# Patient Record
Sex: Female | Born: 1940 | Race: Black or African American | Hispanic: No | Marital: Single | Smoking: Never smoker
Health system: Southern US, Community
[De-identification: ages and names within clinical notes are randomized; demographics above are authoritative.]

## PROBLEM LIST (undated history)

## (undated) DIAGNOSIS — C229 Malignant neoplasm of liver, not specified as primary or secondary: Secondary | ICD-10-CM

## (undated) DIAGNOSIS — I10 Essential (primary) hypertension: Secondary | ICD-10-CM

## (undated) HISTORY — PX: JOINT REPLACEMENT: SHX530

## (undated) HISTORY — PX: HERNIA REPAIR: SHX51

## (undated) HISTORY — PX: ABDOMINAL HYSTERECTOMY: SHX81

## (undated) HISTORY — PX: LIVER SURGERY: SHX698

---

## 1999-09-29 ENCOUNTER — Ambulatory Visit (HOSPITAL_COMMUNITY): Admission: RE | Admit: 1999-09-29 | Discharge: 1999-09-29 | Payer: Self-pay | Admitting: Orthopedic Surgery

## 1999-09-29 ENCOUNTER — Encounter: Payer: Self-pay | Admitting: Orthopedic Surgery

## 1999-10-02 ENCOUNTER — Encounter: Admission: RE | Admit: 1999-10-02 | Discharge: 1999-12-31 | Payer: Self-pay | Admitting: Orthopedic Surgery

## 1999-10-16 ENCOUNTER — Ambulatory Visit (HOSPITAL_COMMUNITY): Admission: RE | Admit: 1999-10-16 | Discharge: 1999-10-16 | Payer: Self-pay | Admitting: Orthopedic Surgery

## 1999-10-16 ENCOUNTER — Encounter: Payer: Self-pay | Admitting: Orthopedic Surgery

## 1999-11-18 ENCOUNTER — Encounter: Payer: Self-pay | Admitting: Orthopedic Surgery

## 1999-11-18 ENCOUNTER — Ambulatory Visit (HOSPITAL_COMMUNITY): Admission: RE | Admit: 1999-11-18 | Discharge: 1999-11-18 | Payer: Self-pay | Admitting: Orthopedic Surgery

## 1999-12-19 ENCOUNTER — Ambulatory Visit (HOSPITAL_COMMUNITY): Admission: RE | Admit: 1999-12-19 | Discharge: 1999-12-19 | Payer: Self-pay | Admitting: Orthopedic Surgery

## 1999-12-19 ENCOUNTER — Encounter: Payer: Self-pay | Admitting: Orthopedic Surgery

## 2011-04-04 ENCOUNTER — Encounter: Payer: Self-pay | Admitting: *Deleted

## 2011-04-04 ENCOUNTER — Emergency Department (HOSPITAL_BASED_OUTPATIENT_CLINIC_OR_DEPARTMENT_OTHER)
Admission: EM | Admit: 2011-04-04 | Discharge: 2011-04-04 | Disposition: A | Discharge status: Home / Self Care | Payer: Medicare Other | Attending: Emergency Medicine | Admitting: Emergency Medicine

## 2011-04-04 DIAGNOSIS — I10 Essential (primary) hypertension: Secondary | ICD-10-CM | POA: Insufficient documentation

## 2011-04-04 DIAGNOSIS — B029 Zoster without complications: Secondary | ICD-10-CM | POA: Insufficient documentation

## 2011-04-04 HISTORY — DX: Essential (primary) hypertension: I10

## 2011-04-04 MED ORDER — ACYCLOVIR 200 MG PO CAPS: 800.0000 mg | ORAL_CAPSULE | Freq: Every day | ORAL | Status: AC

## 2011-04-04 MED ORDER — HYDROCODONE-ACETAMINOPHEN 5-325 MG PO TABS: 1.0000 | ORAL_TABLET | Freq: Three times a day (TID) | ORAL | Status: AC | PRN

## 2011-04-04 MED ORDER — ACYCLOVIR 200 MG PO CAPS
800.0000 mg | ORAL_CAPSULE | Freq: Once | ORAL | Status: AC
  Administered 2011-04-04: 800 mg via ORAL
  Filled 2011-04-04: qty 4

## 2011-04-04 NOTE — ED Notes (Signed)
MD at bedside now.

## 2011-04-04 NOTE — ED Provider Notes (Signed)
History     CSN: [id number] Arrival date & time: 04/04/2011  9:23 PM   First MD Initiated Contact with Patient 04/04/11 2209      Chief Complaint  Patient presents with  . Herpes Zoster    (Consider location/radiation/quality/duration/timing/severity/associated sxs/prior treatment) HPI Patient presents today as after the insidious onset of the right sided rash. She notes that the pain began at the time of rash onset, and since then has been persistent. The pain is described as sharp and burning. The pain is otherwise nonradiating, and the rash has spread to encompass an area from just right of her spine circumferentially wrapping to the right sternal border about the fourth rib. No fevers, no chills, no nausea, vomiting, no chest pain, no dyspnea, no other focal complaints Past Medical History  Diagnosis Date  . Hypertension     Past Surgical History  Procedure Date  . Joint replacement   . Abdominal hysterectomy     History reviewed. No pertinent family history.  History  Substance Use Topics  . Smoking status: Never Smoker   . Smokeless tobacco: Not on file  . Alcohol Use: No    OB History    Grav Para Term Preterm Abortions TAB SAB Ect Mult Living                  Review of Systems  All other systems reviewed and are negative.    Allergies  Review of patient's allergies indicates no known allergies.  Home Medications   Current Outpatient Rx  Name Route Sig Dispense Refill  . VYTORIN PO Oral Take 1 tablet by mouth daily.      . GUAIFENESIN 600 MG PO TB12 Oral Take 1,200 mg by mouth 2 (two) times daily.      Marland Kitchen LISINOPRIL PO Oral Take 1 tablet by mouth daily.      Marland Kitchen LOPRESSOR PO Oral Take 2 tablets by mouth daily.      Frazier Butt OP Ophthalmic Apply 1 drop to eye at bedtime as needed. For dry eyes      . PRESCRIPTION MEDICATION Oral Take 1 tablet by mouth daily. Pain medication     . PRESCRIPTION MEDICATION Oral Take 1 capsule by mouth daily. Acid reflux  medication      . TRIAMTERENE-HCTZ PO Oral Take 1 tablet by mouth daily.        BP 143/73  Pulse 63  Temp(Src) 98.2 F (36.8 C) (Oral)  Resp 16  Ht 5\' 7"  (1.702 m)  Wt 200 lb (90.719 kg)  BMI 31.32 kg/m2  SpO2 100%  Physical Exam  Constitutional: She is oriented to person, place, and time. She appears well-developed and well-nourished. No distress.  HENT:  Head: Normocephalic and atraumatic.  Eyes: EOM are normal. Pupils are equal, round, and reactive to light.  Cardiovascular: Normal rate and regular rhythm.   Murmur heard. Pulmonary/Chest: Breath sounds normal.  Abdominal: She exhibits no distension.  Musculoskeletal: She exhibits no edema and no tenderness.  Neurological: She is alert and oriented to person, place, and time. No cranial nerve deficit.  Skin: Skin is warm and dry. She is not diaphoretic.       There is a rash consistent with zoster about the fourth or fifth thoracic dermatome  Psychiatric: She has a normal mood and affect.    ED Course  Procedures (including critical care time)  Labs Reviewed - No data to display No results found.   No diagnosis found.  MDM  This generally well elderly female presents with several days of rash and pain. On exam she is in no distress and has a rash consistent with herpes zoster. The patient was advised of the likely prolonged course of her illness and the necessity for pain medication and antiretrovirals, as well as the necessity for PMD followup.        Gerhard Munch, MD 04/04/11 2306

## 2011-04-04 NOTE — ED Notes (Signed)
Pt c/o painful rash to right shoulder and chest x 3 day

## 2011-04-04 NOTE — ED Notes (Signed)
Pt c/o pain in right posterior shoulder area with several red lesions noted, pt also has multiple red lesions on right breast.

## 2012-01-23 ENCOUNTER — Emergency Department (HOSPITAL_BASED_OUTPATIENT_CLINIC_OR_DEPARTMENT_OTHER): Payer: Medicare Other

## 2012-01-23 ENCOUNTER — Emergency Department (HOSPITAL_BASED_OUTPATIENT_CLINIC_OR_DEPARTMENT_OTHER)
Admission: EM | Admit: 2012-01-23 | Discharge: 2012-01-23 | Disposition: A | Discharge status: Home / Self Care | Payer: Medicare Other | Attending: Emergency Medicine | Admitting: Emergency Medicine

## 2012-01-23 ENCOUNTER — Other Ambulatory Visit: Payer: Self-pay

## 2012-01-23 ENCOUNTER — Encounter (HOSPITAL_BASED_OUTPATIENT_CLINIC_OR_DEPARTMENT_OTHER): Payer: Self-pay

## 2012-01-23 DIAGNOSIS — Z79899 Other long term (current) drug therapy: Secondary | ICD-10-CM | POA: Insufficient documentation

## 2012-01-23 DIAGNOSIS — R9431 Abnormal electrocardiogram [ECG] [EKG]: Secondary | ICD-10-CM | POA: Insufficient documentation

## 2012-01-23 DIAGNOSIS — I10 Essential (primary) hypertension: Secondary | ICD-10-CM | POA: Insufficient documentation

## 2012-01-23 DIAGNOSIS — J40 Bronchitis, not specified as acute or chronic: Secondary | ICD-10-CM | POA: Insufficient documentation

## 2012-01-23 LAB — BASIC METABOLIC PANEL WITH GFR
BUN: 14 mg/dL (ref 6–23)
CO2: 25 meq/L (ref 19–32)
Calcium: 8.6 mg/dL (ref 8.4–10.5)
Chloride: 105 meq/L (ref 96–112)
Creatinine, Ser: 0.8 mg/dL (ref 0.50–1.10)
GFR calc Af Amer: 83 mL/min — ABNORMAL LOW
GFR calc non Af Amer: 72 mL/min — ABNORMAL LOW
Glucose, Bld: 102 mg/dL — ABNORMAL HIGH (ref 70–99)
Potassium: 3.5 meq/L (ref 3.5–5.1)
Sodium: 140 meq/L (ref 135–145)

## 2012-01-23 LAB — CBC WITH DIFFERENTIAL/PLATELET
Basophils Absolute: 0 10*3/uL (ref 0.0–0.1)
Basophils Relative: 1 % (ref 0–1)
Eosinophils Absolute: 0.1 10*3/uL (ref 0.0–0.7)
Eosinophils Relative: 3 % (ref 0–5)
HCT: 36.9 % (ref 36.0–46.0)
Hemoglobin: 12.5 g/dL (ref 12.0–15.0)
Lymphocytes Relative: 25 % (ref 12–46)
Lymphs Abs: 1.2 10*3/uL (ref 0.7–4.0)
MCH: 28.5 pg (ref 26.0–34.0)
MCHC: 33.9 g/dL (ref 30.0–36.0)
MCV: 84.1 fL (ref 78.0–100.0)
Monocytes Absolute: 0.7 10*3/uL (ref 0.1–1.0)
Monocytes Relative: 14 % — ABNORMAL HIGH (ref 3–12)
Neutro Abs: 2.8 10*3/uL (ref 1.7–7.7)
Neutrophils Relative %: 58 % (ref 43–77)
Platelets: 262 10*3/uL (ref 150–400)
RBC: 4.39 MIL/uL (ref 3.87–5.11)
RDW: 14.7 % (ref 11.5–15.5)
WBC: 4.8 10*3/uL (ref 4.0–10.5)

## 2012-01-23 LAB — TROPONIN I: Troponin I: <0.3 ng/mL

## 2012-01-23 LAB — RAPID STREP SCREEN (MED CTR MEBANE ONLY): Streptococcus, Group A Screen (Direct): NEGATIVE

## 2012-01-23 MED ORDER — DOXYCYCLINE HYCLATE 100 MG PO CAPS: 100.0000 mg | ORAL_CAPSULE | Freq: Two times a day (BID) | ORAL | Status: AC

## 2012-01-23 MED ORDER — TETRACAINE HCL 0.5 % OP SOLN
2.0000 [drp] | Freq: Once | OPHTHALMIC | Status: AC
  Administered 2012-01-23: 2 [drp] via OPHTHALMIC
  Filled 2012-01-23: qty 2

## 2012-01-23 MED ORDER — ALBUTEROL SULFATE HFA 108 (90 BASE) MCG/ACT IN AERS: 1.0000 | INHALATION_SPRAY | Freq: Four times a day (QID) | RESPIRATORY_TRACT | Status: DC | PRN

## 2012-01-23 NOTE — ED Provider Notes (Addendum)
History     CSN: [id number]  Arrival date & time 01/23/12  1747   First MD Initiated Contact with Patient 01/23/12 1806      Chief Complaint  Patient presents with  . Sore Throat  . Fever  . URI    (Consider location/radiation/quality/duration/timing/severity/associated sxs/prior treatment) HPI Comments: Patient complains of one day of body aches, congestion, sore throat and headache. She said intermittent eye itching and cough for the past several weeks. She denies any chest pain or shortness of breath. Her throat is feeling better today. She's had good by mouth intake and urine output. She denies any abdominal pain, nausea or vomiting. She's had rhinorrhea, congestion, body aches and chills and fever. She denies any sick contacts.  The history is provided by the patient.    Past Medical History  Diagnosis Date  . Hypertension     Past Surgical History  Procedure Date  . Joint replacement   . Abdominal hysterectomy     No family history on file.  History  Substance Use Topics  . Smoking status: Never Smoker   . Smokeless tobacco: Not on file  . Alcohol Use: No    OB History    Grav Para Term Preterm Abortions TAB SAB Ect Mult Living                  Review of Systems  Constitutional: Positive for fever and activity change. Negative for diaphoresis.  HENT: Positive for congestion, sore throat and rhinorrhea.   Eyes: Positive for redness and itching. Negative for visual disturbance.  Respiratory: Positive for cough. Negative for chest tightness and shortness of breath.   Cardiovascular: Negative for chest pain.  Gastrointestinal: Negative for nausea, vomiting and abdominal pain.  Musculoskeletal: Positive for myalgias and arthralgias. Negative for back pain.  Skin: Negative for rash.  Neurological: Positive for headaches. Negative for weakness.    Allergies  Review of patient's allergies indicates no known allergies.  Home Medications   Current  Outpatient Rx  Name Route Sig Dispense Refill  . VITAMIN C PO Oral Take 1 tablet by mouth daily.    Marland Kitchen EZETIMIBE-SIMVASTATIN 10-40 MG PO TABS Oral Take 1 tablet by mouth at bedtime.    Marland Kitchen LISINOPRIL 10 MG PO TABS Oral Take 10 mg by mouth daily.    Marland Kitchen LORAZEPAM 1 MG PO TABS Oral Take 0.5-1 mg by mouth 2 (two) times daily as needed. For rest or anxiety    . METOPROLOL TARTRATE 50 MG PO TABS Oral Take 50 mg by mouth 2 (two) times daily.    Frazier Butt OP Ophthalmic Apply 1 drop to eye 2 (two) times daily as needed. For dry eyes     . TRIAMTERENE-HCTZ 37.5-25 MG PO TABS Oral Take 1 tablet by mouth daily.    . ALBUTEROL SULFATE HFA 108 (90 BASE) MCG/ACT IN AERS Inhalation Inhale 1-2 puffs into the lungs every 6 (six) hours as needed for wheezing. 1 Inhaler 0  . DOXYCYCLINE HYCLATE 100 MG PO CAPS Oral Take 1 capsule (100 mg total) by mouth 2 (two) times daily. 20 capsule 0    BP 143/82  Pulse 84  Temp 98 F (36.7 C) (Oral)  Resp 16  Ht 5\' 5"  (1.651 m)  Wt 204 lb 8 oz (92.761 kg)  BMI 34.03 kg/m2  SpO2 97%  Physical Exam  Constitutional: She is oriented to person, place, and time. She appears well-developed and well-nourished. No distress.  HENT:  Head: Normocephalic and atraumatic.  Right Ear: External ear normal.  Left Ear: External ear normal.  Mouth/Throat: Oropharynx is clear and moist. No oropharyngeal exudate.       Mild oropharyngeal erythema, dry mucous membranes, no exudate  Eyes: Conjunctivae and EOM are normal. Pupils are equal, round, and reactive to light.       IOP 24 bilaterally  Neck: Normal range of motion. Neck supple.       No meningismus  Cardiovascular: Normal rate, regular rhythm and normal heart sounds.   No murmur heard. Pulmonary/Chest: Effort normal and breath sounds normal. No respiratory distress.  Abdominal: Soft. There is no tenderness. There is no rebound and no guarding.  Musculoskeletal: Normal range of motion. She exhibits no edema and no tenderness.    Neurological: She is alert and oriented to person, place, and time. No cranial nerve deficit.  Skin: Skin is warm.    ED Course  Procedures (including critical care time)  Labs Reviewed  CBC WITH DIFFERENTIAL - Abnormal; Notable for the following:    Monocytes Relative 14 (*)     All other components within normal limits  BASIC METABOLIC PANEL - Abnormal; Notable for the following:    Glucose, Bld 102 (*)     GFR calc non Af Amer 72 (*)     GFR calc Af Amer 83 (*)     All other components within normal limits  RAPID STREP SCREEN  TROPONIN I   Dg Chest 2 View  01/23/2012  *RADIOLOGY REPORT*  Clinical Data: Cough  CHEST - 2 VIEW  Comparison: None.  Findings: Cardiomediastinal silhouette appears normal.  No acute pulmonary disease is noted.  Bony thorax is intact.  IMPRESSION: No acute cardiopulmonary abnormality seen.   Original Report Authenticated By: Venita Sheffield., M.D.      1. Bronchitis   2. Abnormal EKG       MDM  Cough, congestion, sore throat with eyes soreness and body aches for 2 days. Vital stable, no distress.  Patient appears well and is no distress. Chest x-ray negative. Rapid strep test negative.  Her EKG shows anterior T-wave changes without comparison. She does not have any chest pain or shortness of breath. She will need followup with Dr. polite and cardiology. Presentation today not consistent with ACS.  EKG obtained from [facility] from 03/28/2009. This shows similar T wave inversions anteriorly and inferiorly.   Date: 01/23/2012  Rate: 59  Rhythm: sinus bradycardia  QRS Axis: left  Intervals: normal  ST/T Wave abnormalities: nonspecific T wave changes  Conduction Disutrbances:first-degree A-V block   Narrative Interpretation:   Old EKG Reviewed: none available       Glynn Octave, MD 01/23/12 2051  Glynn Octave, MD 01/24/12 [phone]

## 2012-01-23 NOTE — ED Notes (Signed)
Pt sts she just doesn't feel good.  Sore throat last night but not today.  Sts she had a fever but when asked how high she sts she didn't check it.  Sts she has been coughing up clear sputum.  Sts "I just go to sweatin."   "My mouth stays full all the time.  Nothing else is hurting me."

## 2012-01-23 NOTE — ED Notes (Signed)
Pt reports fever, congestion and sore throat that started yesterday.

## 2012-09-17 ENCOUNTER — Encounter (HOSPITAL_BASED_OUTPATIENT_CLINIC_OR_DEPARTMENT_OTHER): Payer: Self-pay

## 2012-09-17 ENCOUNTER — Emergency Department (HOSPITAL_BASED_OUTPATIENT_CLINIC_OR_DEPARTMENT_OTHER): Payer: Medicare Other

## 2012-09-17 ENCOUNTER — Emergency Department (HOSPITAL_BASED_OUTPATIENT_CLINIC_OR_DEPARTMENT_OTHER)
Admission: EM | Admit: 2012-09-17 | Discharge: 2012-09-17 | Disposition: A | Discharge status: Home / Self Care | Payer: Medicare Other | Attending: Emergency Medicine | Admitting: Emergency Medicine

## 2012-09-17 DIAGNOSIS — M2540 Effusion, unspecified joint: Secondary | ICD-10-CM | POA: Insufficient documentation

## 2012-09-17 DIAGNOSIS — R911 Solitary pulmonary nodule: Secondary | ICD-10-CM | POA: Insufficient documentation

## 2012-09-17 DIAGNOSIS — R918 Other nonspecific abnormal finding of lung field: Secondary | ICD-10-CM

## 2012-09-17 DIAGNOSIS — M25519 Pain in unspecified shoulder: Secondary | ICD-10-CM | POA: Insufficient documentation

## 2012-09-17 DIAGNOSIS — Z79899 Other long term (current) drug therapy: Secondary | ICD-10-CM | POA: Insufficient documentation

## 2012-09-17 DIAGNOSIS — I10 Essential (primary) hypertension: Secondary | ICD-10-CM | POA: Insufficient documentation

## 2012-09-17 LAB — CBC WITH DIFFERENTIAL/PLATELET
Eosinophils Relative: 2 % (ref 0–5)
HCT: 38.4 % (ref 36.0–46.0)
Lymphocytes Relative: 23 % (ref 12–46)
Lymphs Abs: 2.3 10*3/uL (ref 0.7–4.0)
MCV: 82.1 fL (ref 78.0–100.0)
Monocytes Absolute: 1 10*3/uL (ref 0.1–1.0)
RBC: 4.68 MIL/uL (ref 3.87–5.11)
WBC: 9.7 10*3/uL (ref 4.0–10.5)

## 2012-09-17 LAB — COMPREHENSIVE METABOLIC PANEL
ALT: 9 U/L (ref 0–35)
CO2: 25 mEq/L (ref 19–32)
Calcium: 9.1 mg/dL (ref 8.4–10.5)
Creatinine, Ser: 0.9 mg/dL (ref 0.50–1.10)
GFR calc Af Amer: 72 mL/min — ABNORMAL LOW (ref >90)
GFR calc non Af Amer: 62 mL/min — ABNORMAL LOW (ref >90)
Glucose, Bld: 110 mg/dL — ABNORMAL HIGH (ref 70–99)
Sodium: 139 mEq/L (ref 135–145)

## 2012-09-17 MED ORDER — ONDANSETRON HCL 4 MG/2ML IJ SOLN
4.0000 mg | Freq: Once | INTRAMUSCULAR | Status: AC
  Administered 2012-09-17: 4 mg via INTRAVENOUS
  Filled 2012-09-17: qty 2

## 2012-09-17 MED ORDER — SODIUM CHLORIDE 0.9 % IV SOLN
Freq: Once | INTRAVENOUS | Status: AC
  Administered 2012-09-17: 20:00:00 via INTRAVENOUS

## 2012-09-17 MED ORDER — HYDROMORPHONE HCL PF 1 MG/ML IJ SOLN
1.0000 mg | Freq: Once | INTRAMUSCULAR | Status: AC
  Administered 2012-09-17: 1 mg via INTRAVENOUS
  Filled 2012-09-17: qty 1

## 2012-09-17 MED ORDER — HYDROCODONE-ACETAMINOPHEN 5-325 MG PO TABS: 2.0000 | ORAL_TABLET | ORAL | Status: DC | PRN

## 2012-09-17 MED ORDER — IOHEXOL 350 MG/ML SOLN
80.0000 mL | Freq: Once | INTRAVENOUS | Status: AC | PRN
  Administered 2012-09-17: 80 mL via INTRAVENOUS

## 2012-09-17 NOTE — ED Provider Notes (Signed)
Date: 09/17/2012  Rate:91  Rhythm: normal sinus rhythm  QRS Axis: left  Intervals: normal  ST/T Wave abnormalities: nonspecific T wave changes  Conduction Disutrbances:none  Narrative Interpretation: Abnormal EKG  Old EKG Reviewed: unchanged    Carleene Cooper III, MD 09/17/12 [phone]

## 2012-09-17 NOTE — ED Notes (Signed)
Pt states that she has severe r shoulder pain, and R upper back pain.  Pt states that last night she could not get comfortable, applied heat with no relief.  Pt believed it was gas.  Incr pain with movement.

## 2012-09-17 NOTE — ED Notes (Signed)
Patient transported to CT 

## 2012-09-17 NOTE — ED Provider Notes (Signed)
History     CSN: [id number]  Arrival date & time 09/17/12  1731   First MD Initiated Contact with Patient 09/17/12 1931      Chief Complaint  Patient presents with  . Back Pain  . Shoulder Pain    (Consider location/radiation/quality/duration/timing/severity/associated sxs/prior treatment) Patient is a 72 y.o. female presenting with back pain. The history is provided by the patient. No language interpreter was used.  Back Pain Quality:  Aching and stabbing Radiates to:  R shoulder Pain severity:  Severe Pain is:  Same all the time Duration:  2 days Timing:  Constant Progression:  Worsening Chronicity:  New Relieved by:  Nothing Worsened by:  Nothing tried Ineffective treatments:  None tried Pt complains of severe pain in right upper back that radiates to the right front of her chest.  Pt complains of severe pain with movement.  Pt reports no fever, no cough  Past Medical History  Diagnosis Date  . Hypertension     Past Surgical History  Procedure Laterality Date  . Joint replacement    . Abdominal hysterectomy      History reviewed. No pertinent family history.  History  Substance Use Topics  . Smoking status: Never Smoker   . Smokeless tobacco: Not on file  . Alcohol Use: No    OB History   Grav Para Term Preterm Abortions TAB SAB Ect Mult Living                  Review of Systems  Musculoskeletal: Positive for back pain and joint swelling.  All other systems reviewed and are negative.    Allergies  Review of patient's allergies indicates no known allergies.  Home Medications   Current Outpatient Rx  Name  Route  Sig  Dispense  Refill  . ezetimibe-simvastatin (VYTORIN) 10-40 MG per tablet   Oral   Take 1 tablet by mouth at bedtime.         Marland Kitchen lisinopril (PRINIVIL,ZESTRIL) 10 MG tablet   Oral   Take 10 mg by mouth daily.         Marland Kitchen LORazepam (ATIVAN) 1 MG tablet   Oral   Take 0.5-1 mg by mouth 2 (two) times daily as needed. For rest or  anxiety         . metoprolol (LOPRESSOR) 50 MG tablet   Oral   Take 50 mg by mouth 2 (two) times daily.         Bertram Gala Glycol-Propyl Glycol (SYSTANE OP)   Ophthalmic   Apply 1 drop to eye 2 (two) times daily as needed. For dry eyes          . albuterol (PROVENTIL HFA;VENTOLIN HFA) 108 (90 BASE) MCG/ACT inhaler   Inhalation   Inhale 1-2 puffs into the lungs every 6 (six) hours as needed for wheezing.   1 Inhaler   0   . Ascorbic Acid (VITAMIN C PO)   Oral   Take 1 tablet by mouth daily.         Marland Kitchen triamterene-hydrochlorothiazide (MAXZIDE-25) 37.5-25 MG per tablet   Oral   Take 1 tablet by mouth daily.           BP 148/75  Pulse 90  Temp(Src) 98.9 F (37.2 C) (Oral)  Resp 20  Ht 5\' 6"  (1.676 m)  Wt 200 lb (90.719 kg)  BMI 32.3 kg/m2  SpO2 98%  Physical Exam  Nursing note and vitals reviewed. Constitutional: She appears well-developed and well-nourished.  HENT:  Head: Normocephalic.  Nose: Nose normal.  Mouth/Throat: Oropharynx is clear and moist.  Eyes: Conjunctivae and EOM are normal. Pupils are equal, round, and reactive to light.  Neck: Normal range of motion. Neck supple.  Cardiovascular: Normal rate and regular rhythm.   Pulmonary/Chest: Effort normal and breath sounds normal.  Abdominal: Soft. Bowel sounds are normal.  Musculoskeletal:  Pain with moving shoulder,  No pain to palpation  Neurological: She is alert.  Skin: Skin is warm.  Psychiatric: She has a normal mood and affect.    ED Course  Procedures (including critical care time)  Labs Reviewed  CBC WITH DIFFERENTIAL - Abnormal; Notable for the following:    RDW 15.6 (*)    All other components within normal limits  COMPREHENSIVE METABOLIC PANEL - Abnormal; Notable for the following:    Potassium 3.4 (*)    Glucose, Bld 110 (*)    Albumin 3.3 (*)    GFR calc non Af Amer 62 (*)    GFR calc Af Amer 72 (*)    All other components within normal limits  TROPONIN I   Dg Chest 2  View  09/17/2012  *RADIOLOGY REPORT*  Clinical Data: Severe right shoulder pain, right upper back pain, unable to gate comfortable, history hypertension  CHEST - 2 VIEW  Comparison: 01/23/2012  Findings: Borderline enlargement of cardiac silhouette. Tortuous aorta. Pulmonary vascularity normal. Linear subsegmental atelectasis at lingula. Lungs otherwise clear. No pleural effusion or pneumothorax. Scattered endplate spur formation thoracic spine.  IMPRESSION: Borderline enlargement of cardiac silhouette. Minimal subsegmental atelectasis at lingula.   Original Report Authenticated By: Ulyses Southward, M.D.      No diagnosis found.    MDM  Pt given IV dilaudid and zofran with relief,   Labs reviewed,   Ct scan shows small pulmonary nodules.   Pt advised she needs to see her MD for recheck and that she needs follow up and repeat ct scan        Elson Areas, PA-C 09/17/12 2241

## 2012-09-18 NOTE — ED Provider Notes (Signed)
Medical screening examination/treatment/procedure(s) were performed by non-physician practitioner and as supervising physician I was immediately available for consultation/collaboration.   Carleene Cooper III, MD 09/18/12 [phone]

## 2013-01-05 ENCOUNTER — Encounter (HOSPITAL_BASED_OUTPATIENT_CLINIC_OR_DEPARTMENT_OTHER): Payer: Self-pay | Admitting: *Deleted

## 2013-01-05 ENCOUNTER — Emergency Department (HOSPITAL_BASED_OUTPATIENT_CLINIC_OR_DEPARTMENT_OTHER)
Admission: EM | Admit: 2013-01-05 | Discharge: 2013-01-05 | Disposition: A | Discharge status: Home / Self Care | Payer: Medicare Other | Attending: Emergency Medicine | Admitting: Emergency Medicine

## 2013-01-05 DIAGNOSIS — H81391 Other peripheral vertigo, right ear: Secondary | ICD-10-CM

## 2013-01-05 DIAGNOSIS — H81319 Aural vertigo, unspecified ear: Secondary | ICD-10-CM | POA: Insufficient documentation

## 2013-01-05 DIAGNOSIS — Z79899 Other long term (current) drug therapy: Secondary | ICD-10-CM | POA: Insufficient documentation

## 2013-01-05 DIAGNOSIS — I10 Essential (primary) hypertension: Secondary | ICD-10-CM | POA: Insufficient documentation

## 2013-01-05 DIAGNOSIS — H9209 Otalgia, unspecified ear: Secondary | ICD-10-CM | POA: Insufficient documentation

## 2013-01-05 MED ORDER — SODIUM CHLORIDE 0.9 % IV SOLN
Freq: Once | INTRAVENOUS | Status: AC
  Administered 2013-01-05: 20 mL/h via INTRAVENOUS

## 2013-01-05 MED ORDER — DIAZEPAM 5 MG/ML IJ SOLN
2.5000 mg | Freq: Once | INTRAMUSCULAR | Status: AC
  Administered 2013-01-05: 2.5 mg via INTRAVENOUS
  Filled 2013-01-05: qty 2

## 2013-01-05 MED ORDER — DIAZEPAM 5 MG PO TABS: 2.5000 mg | ORAL_TABLET | Freq: Three times a day (TID) | ORAL | Status: DC | PRN

## 2013-01-05 NOTE — ED Notes (Signed)
Pt sts she has been treated for sinus infection with cephalexin. Last night she became dizzy when she moved her head to fast and it happened again this am. EMS was called out but pt's VS were stable. Pt here for check up.

## 2013-01-05 NOTE — ED Notes (Signed)
MD at bedside. 

## 2013-01-05 NOTE — ED Notes (Signed)
Attempted IV start x1 in pt's right ac. Good blood return but catheter would not advance. A couple of minutes after removing catheter, pt began c/o paralysis in her right arm, all the while moving right arm around with full ROM. Pt then c/o of cramping in her right bicep. Bicep muscle palpated and found to be soft and pliable with no cramping.

## 2013-01-05 NOTE — ED Provider Notes (Signed)
CSN: [id number]     Arrival date & time 01/05/13  0456 History     First MD Initiated Contact with Patient 01/05/13 [phone]     Chief Complaint  Patient presents with  . Dizziness   (Consider location/radiation/quality/duration/timing/severity/associated sxs/prior Treatment) HPI This is a 72 year old female recently treated for sinus infection with Keflex. She began having dizziness yesterday evening about 9 PM by which she means the room is spinning. It is exacerbated by movement of her head. Symptoms are moderate to severe her worst. She's taken 3 meclizine tablets since then with partial improvement. She continues to have vertiginous sensations when she moves her head despite taking meclizine an hour ago. She said her head feels "stuffy". She denies nausea or vomiting. She is having pain in her right ear as well as decreased hearing in her right ear.  Her symptoms are similar to previous episodes of vertigo.   Past Medical History  Diagnosis Date  . Hypertension    Past Surgical History  Procedure Laterality Date  . Joint replacement    . Abdominal hysterectomy     No family history on file. History  Substance Use Topics  . Smoking status: Never Smoker   . Smokeless tobacco: Not on file  . Alcohol Use: No   OB History   Grav Para Term Preterm Abortions TAB SAB Ect Mult Living                 Review of Systems  All other systems reviewed and are negative.    Allergies  Review of patient's allergies indicates no known allergies.  Home Medications   Current Outpatient Rx  Name  Route  Sig  Dispense  Refill  . cephALEXin (KEFLEX) 500 MG capsule   Oral   Take 500 mg by mouth 2 (two) times daily.         . meclizine (ANTIVERT) 25 MG tablet   Oral   Take 25 mg by mouth 3 (three) times daily as needed.         Marland Kitchen albuterol (PROVENTIL HFA;VENTOLIN HFA) 108 (90 BASE) MCG/ACT inhaler   Inhalation   Inhale 1-2 puffs into the lungs every 6 (six) hours as needed for  wheezing.   1 Inhaler   0   . Ascorbic Acid (VITAMIN C PO)   Oral   Take 1 tablet by mouth daily.         Marland Kitchen ezetimibe-simvastatin (VYTORIN) 10-40 MG per tablet   Oral   Take 1 tablet by mouth at bedtime.         Marland Kitchen HYDROcodone-acetaminophen (NORCO/VICODIN) 5-325 MG per tablet   Oral   Take 2 tablets by mouth every 4 (four) hours as needed.   16 tablet   0   . lisinopril (PRINIVIL,ZESTRIL) 10 MG tablet   Oral   Take 10 mg by mouth daily.         Marland Kitchen LORazepam (ATIVAN) 1 MG tablet   Oral   Take 0.5-1 mg by mouth 2 (two) times daily as needed. For rest or anxiety         . metoprolol (LOPRESSOR) 50 MG tablet   Oral   Take 50 mg by mouth 2 (two) times daily.         Bertram Gala Glycol-Propyl Glycol (SYSTANE OP)   Ophthalmic   Apply 1 drop to eye 2 (two) times daily as needed. For dry eyes          . triamterene-hydrochlorothiazide (MAXZIDE-25) 37.5-25  MG per tablet   Oral   Take 1 tablet by mouth daily.          BP 152/64  Pulse 60  Temp(Src) 97.9 F (36.6 C) (Oral)  Resp 18  Wt 200 lb (90.719 kg)  BMI 32.3 kg/m2  SpO2 96%  Physical Exam General: Well-developed, well-nourished female in no acute distress; appearance consistent with age of record HENT: normocephalic, atraumatic; TMs normal bilaterally Eyes: pupils equal round and reactive to light; extraocular muscles intact; no nystagmus Neck: supple Heart: regular rate and rhythm Lungs: clear to auscultation bilaterally Abdomen: soft; nondistended; nontender Extremities: No deformity; full range of motion; pulses normal Neurologic: Awake, alert and oriented; motor function intact in all extremities and symmetric; no facial droop; normal coordination speech Skin: Warm and dry Psychiatric: Flat affect    ED Course   Procedures (including critical care time)   MDM  6:05 AM Patient resting comfortably after 2.5 mg of Valium IV. Vertigo symptoms nearly completely abated. We will switch her  from meclizine to Valium with the advice that Valium may be more sedating.  EKG Interpretation:  Date & Time: 01/05/2013 5:13 AM  Rate: 56  Rhythm: sinus bradycardia  QRS Axis: normal  Intervals: normal  ST/T Wave abnormalities: Nonspecific T wave changes  Conduction Disutrbances:none  Narrative Interpretation:   Old EKG Reviewed: Rate is slower       Hanley Seamen, MD 01/05/13 [phone]

## 2014-11-30 ENCOUNTER — Encounter (HOSPITAL_BASED_OUTPATIENT_CLINIC_OR_DEPARTMENT_OTHER): Payer: Self-pay | Admitting: *Deleted

## 2014-11-30 ENCOUNTER — Emergency Department (HOSPITAL_BASED_OUTPATIENT_CLINIC_OR_DEPARTMENT_OTHER): Payer: Medicare Other

## 2014-11-30 ENCOUNTER — Emergency Department (HOSPITAL_BASED_OUTPATIENT_CLINIC_OR_DEPARTMENT_OTHER)
Admission: EM | Admit: 2014-11-30 | Discharge: 2014-11-30 | Disposition: A | Discharge status: Other Acute Inpatient Hospital | Payer: Medicare Other | Attending: Emergency Medicine | Admitting: Emergency Medicine

## 2014-11-30 DIAGNOSIS — R200 Anesthesia of skin: Secondary | ICD-10-CM | POA: Insufficient documentation

## 2014-11-30 DIAGNOSIS — Z792 Long term (current) use of antibiotics: Secondary | ICD-10-CM | POA: Insufficient documentation

## 2014-11-30 DIAGNOSIS — Z79899 Other long term (current) drug therapy: Secondary | ICD-10-CM | POA: Insufficient documentation

## 2014-11-30 DIAGNOSIS — R202 Paresthesia of skin: Secondary | ICD-10-CM | POA: Insufficient documentation

## 2014-11-30 DIAGNOSIS — I10 Essential (primary) hypertension: Secondary | ICD-10-CM | POA: Diagnosis not present

## 2014-11-30 DIAGNOSIS — G64 Other disorders of peripheral nervous system: Secondary | ICD-10-CM | POA: Insufficient documentation

## 2014-11-30 LAB — BASIC METABOLIC PANEL
Anion gap: 10 (ref 5–15)
BUN: 22 mg/dL — ABNORMAL HIGH (ref 6–20)
CHLORIDE: 106 mmol/L (ref 101–111)
CO2: 23 mmol/L (ref 22–32)
CREATININE: 0.85 mg/dL (ref 0.44–1.00)
Calcium: 9 mg/dL (ref 8.9–10.3)
GFR calc Af Amer: >60 mL/min (ref >60)
Glucose, Bld: 149 mg/dL — ABNORMAL HIGH (ref 65–99)
POTASSIUM: 3.7 mmol/L (ref 3.5–5.1)
SODIUM: 139 mmol/L (ref 135–145)

## 2014-11-30 LAB — CBC
HEMATOCRIT: 39.5 % (ref 36.0–46.0)
Hemoglobin: 13 g/dL (ref 12.0–15.0)
MCH: 27.8 pg (ref 26.0–34.0)
MCHC: 32.9 g/dL (ref 30.0–36.0)
MCV: 84.6 fL (ref 78.0–100.0)
Platelets: 332 10*3/uL (ref 150–400)
RBC: 4.67 MIL/uL (ref 3.87–5.11)
RDW: 14.9 % (ref 11.5–15.5)
WBC: 10.9 10*3/uL — ABNORMAL HIGH (ref 4.0–10.5)

## 2014-11-30 LAB — TROPONIN I

## 2014-11-30 MED ORDER — ASPIRIN 81 MG PO CHEW
324.0000 mg | CHEWABLE_TABLET | Freq: Once | ORAL | Status: AC
  Administered 2014-11-30: 324 mg via ORAL
  Filled 2014-11-30: qty 4

## 2014-11-30 NOTE — ED Notes (Signed)
Pt with [facility] for transfer to [facility]

## 2014-11-30 NOTE — ED Notes (Signed)
[facility] called for transport at 2244 to [facility]

## 2014-11-30 NOTE — ED Notes (Signed)
House Supervisor at [facility] to call with bed assignment for pt.

## 2014-11-30 NOTE — ED Provider Notes (Signed)
CSN: [id number]     Arrival date & time 11/30/14  1931 History  This chart was scribed for Evelina Bucy, MD by Irene Pap, ED Scribe. This patient was seen in room MH05/MH05 and patient care was started at 8:18 PM.   Chief Complaint  Patient presents with  . Numbness   Patient is a 74 y.o. female presenting with neurologic complaint. The history is provided by the patient. No language interpreter was used.  Neurologic Problem This is a new problem. The current episode started 6 to 12 hours ago. The problem occurs constantly. The problem has not changed since onset.Pertinent negatives include no chest pain and no shortness of breath. She has tried nothing for the symptoms.    HPI Comments: Sara Morton is a 74 y.o. Female with a history of HTN who presents to the Emergency Department complaining of constant left arm and leg numbness and tingling onset 8 hours ago. Reports that it is mostly in her left hand but will sometimes radiate up her left arm. She reports that she has GERD but states that her acid reflux pain does not usually bring numbness and tingling. She denies speech abnormalities, SOB, chest pain, nausea, vomiting, diarrhea, numbness and weakness on the right side, facial numbness, or weakness. Denies history of stroke, heart problems or diabetes. Denies having a PCP.   Past Medical History  Diagnosis Date  . Hypertension    Past Surgical History  Procedure Laterality Date  . Joint replacement    . Abdominal hysterectomy     No family history on file. History  Substance Use Topics  . Smoking status: Never Smoker   . Smokeless tobacco: Not on file  . Alcohol Use: No   OB History    No data available     Review of Systems  Respiratory: Negative for shortness of breath.   Cardiovascular: Negative for chest pain.  Gastrointestinal: Negative for nausea, vomiting and diarrhea.  Neurological: Positive for numbness. Negative for weakness.  All other systems reviewed and  are negative.  Allergies  Review of patient's allergies indicates no known allergies.  Home Medications   Prior to Admission medications   Medication Sig Start Date End Date Taking? Authorizing Provider  albuterol (PROVENTIL HFA;VENTOLIN HFA) 108 (90 BASE) MCG/ACT inhaler Inhale 1-2 puffs into the lungs every 6 (six) hours as needed for wheezing. 01/23/12 01/22/13  Ezequiel Essex, MD  Ascorbic Acid (VITAMIN C PO) Take 1 tablet by mouth daily.    Historical Provider, MD  cephALEXin (KEFLEX) 500 MG capsule Take 500 mg by mouth 2 (two) times daily.    Historical Provider, MD  diazepam (VALIUM) 5 MG tablet Take 0.5-1 tablets (2.5-5 mg total) by mouth every 8 (eight) hours as needed (for vertigo; may cause drowsiness). 01/05/13   John Molpus, MD  ezetimibe-simvastatin (VYTORIN) 10-40 MG per tablet Take 1 tablet by mouth at bedtime.    Historical Provider, MD  HYDROcodone-acetaminophen (NORCO/VICODIN) 5-325 MG per tablet Take 2 tablets by mouth every 4 (four) hours as needed. 09/17/12   Fransico Meadow, PA-C  lisinopril (PRINIVIL,ZESTRIL) 10 MG tablet Take 10 mg by mouth daily.    Historical Provider, MD  LORazepam (ATIVAN) 1 MG tablet Take 0.5-1 mg by mouth 2 (two) times daily as needed. For rest or anxiety    Historical Provider, MD  meclizine (ANTIVERT) 25 MG tablet Take 25 mg by mouth 3 (three) times daily as needed.    Historical Provider, MD  metoprolol (LOPRESSOR) 50 MG  tablet Take 50 mg by mouth 2 (two) times daily.    Historical Provider, MD  Polyethyl Glycol-Propyl Glycol (SYSTANE OP) Apply 1 drop to eye 2 (two) times daily as needed. For dry eyes     Historical Provider, MD  triamterene-hydrochlorothiazide (MAXZIDE-25) 37.5-25 MG per tablet Take 1 tablet by mouth daily.    Historical Provider, MD   BP 134/68 mmHg  Pulse 72  Temp(Src) 97.7 F (36.5 C) (Oral)  Resp 18  Ht 5\' 5"  (1.651 m)  Wt 220 lb (99.791 kg)  BMI 36.61 kg/m2  SpO2 98% Physical Exam  Constitutional: She is oriented to  person, place, and time. She appears well-developed and well-nourished. No distress.  HENT:  Head: Normocephalic and atraumatic.  Mouth/Throat: Oropharynx is clear and moist.  Eyes: EOM are normal. Pupils are equal, round, and reactive to light.  Neck: Normal range of motion. Neck supple.  Cardiovascular: Normal rate and regular rhythm.  Exam reveals no friction rub.   No murmur heard. Pulmonary/Chest: Effort normal and breath sounds normal. No respiratory distress. She has no wheezes. She has no rales.  Abdominal: Soft. She exhibits no distension. There is no tenderness. There is no rebound.  Musculoskeletal: Normal range of motion. She exhibits no edema.  Neurological: She is alert and oriented to person, place, and time. A sensory deficit (mild altered light touch in L arm and leg) is present. She exhibits normal muscle tone. GCS eye subscore is 4. GCS verbal subscore is 5. GCS motor subscore is 6.  Skin: No rash noted. She is not diaphoretic.  Nursing note and vitals reviewed.   ED Course  Procedures (including critical care time) DIAGNOSTIC STUDIES: Oxygen Saturation is 98% on RA, normal by my interpretation.    COORDINATION OF CARE: 8:23 PM-Discussed treatment plan which includes CT scan, labs and possibility of transfer to [facility] with pt at bedside and pt agreed to plan.  Labs Review Labs Reviewed  CBC - Abnormal; Notable for the following:    WBC 10.9 (*)    All other components within normal limits  BASIC METABOLIC PANEL - Abnormal; Notable for the following:    Glucose, Bld 149 (*)    BUN 22 (*)    All other components within normal limits  TROPONIN I    Imaging Review Ct Head Wo Contrast  11/30/2014   CLINICAL DATA:  Initial evaluation for left arm and leg numbness and tingling for 8 hours.  EXAM: CT HEAD WITHOUT CONTRAST  TECHNIQUE: Contiguous axial images were obtained from the base of the skull through the vertex without intravenous contrast.   COMPARISON:  None.  FINDINGS: Cerebral volume within normal limits for patient age.  Scattered vascular calcifications present within the carotid siphons and vertebrobasilar system. Patchy hypodensity within the periventricular white matter most consistent with chronic small vessel ischemic disease.  No acute large vessel territory infarct. No intracranial hemorrhage. No mass lesion, midline shift, or mass effect. No hydrocephalus. No extra-axial fluid collection.  Scalp soft tissues within normal limits. No acute abnormality about the orbits.  Calvarium intact. Minimal polypoid opacity within the lateral recess of the left sphenoid sinus. Paranasal sinuses and mastoid air cells are otherwise well pneumatized and free of fluid.  IMPRESSION: 1. No acute intracranial process. 2. Moderate chronic microvascular ischemic disease.   Electronically Signed   By: Jeannine Boga M.D.   On: 11/30/2014 21:02     EKG Interpretation   Date/Time:  Thursday November 30 2014 19:59:24 EDT  Ventricular Rate:  65 PR Interval:  172 QRS Duration: 84 QT Interval:  422 QTC Calculation: 438 R Axis:   -45 Text Interpretation:  Normal sinus rhythm Left anterior fascicular block  Nonspecific T wave abnormality Abnormal ECG No significant change since  last tracing Confirmed by Mingo Amber  MD, [place] ([id number]) on 11/30/2014 8:03:33 PM      MDM   Final diagnoses:  Numbness and tingling in left arm  Numbness and tingling of left leg    74 year old female here with numbness in her left arm and left leg since since around noon. She cannot give me a definitive last known normal time. I saw her around 8:15 PM, which without a normal time puts her out of the window for TPA. She is also a low NIH score because she is only complaining of mild left arm and left leg tingling. Has been constant. No alleviating or exacerbating factors. She has normal strength" all other extremities. Normal cranial nerves. She denies any speech problems. No  history of strokes, but she does have history of hyperlipidemia, high blood pressure. Will plan on CT of her head, labs, and admission to [facility].  CT normal. Aspirin given. Dr. Florene Glen at [facility] admitting.   I personally performed the services described in this documentation, which was scribed in my presence. The recorded information has been reviewed and is accurate.     Evelina Bucy, MD 11/30/14 2118

## 2014-11-30 NOTE — ED Notes (Signed)
Numbness in her left arm and leg since noon today. No hx of stroke. She is alert oriented. Drove herself here.

## 2016-10-07 ENCOUNTER — Emergency Department (HOSPITAL_BASED_OUTPATIENT_CLINIC_OR_DEPARTMENT_OTHER): Payer: Medicare HMO

## 2016-10-07 ENCOUNTER — Encounter (HOSPITAL_BASED_OUTPATIENT_CLINIC_OR_DEPARTMENT_OTHER): Payer: Self-pay | Admitting: *Deleted

## 2016-10-07 ENCOUNTER — Emergency Department (HOSPITAL_BASED_OUTPATIENT_CLINIC_OR_DEPARTMENT_OTHER)
Admission: EM | Admit: 2016-10-07 | Discharge: 2016-10-07 | Disposition: A | Discharge status: Home / Self Care | Payer: Medicare HMO | Attending: Emergency Medicine | Admitting: Emergency Medicine

## 2016-10-07 DIAGNOSIS — R1013 Epigastric pain: Secondary | ICD-10-CM | POA: Insufficient documentation

## 2016-10-07 DIAGNOSIS — R072 Precordial pain: Secondary | ICD-10-CM

## 2016-10-07 DIAGNOSIS — I10 Essential (primary) hypertension: Secondary | ICD-10-CM | POA: Diagnosis not present

## 2016-10-07 DIAGNOSIS — R002 Palpitations: Secondary | ICD-10-CM | POA: Diagnosis not present

## 2016-10-07 DIAGNOSIS — R61 Generalized hyperhidrosis: Secondary | ICD-10-CM | POA: Diagnosis not present

## 2016-10-07 DIAGNOSIS — Z79899 Other long term (current) drug therapy: Secondary | ICD-10-CM | POA: Insufficient documentation

## 2016-10-07 LAB — BASIC METABOLIC PANEL
Anion gap: 9 (ref 5–15)
BUN: 12 mg/dL (ref 6–20)
CALCIUM: 8.8 mg/dL — AB (ref 8.9–10.3)
CO2: 27 mmol/L (ref 22–32)
CREATININE: 0.7 mg/dL (ref 0.44–1.00)
Chloride: 101 mmol/L (ref 101–111)
Glucose, Bld: 98 mg/dL (ref 65–99)
Potassium: 3.6 mmol/L (ref 3.5–5.1)
SODIUM: 137 mmol/L (ref 135–145)

## 2016-10-07 LAB — CBC
HCT: 37.3 % (ref 36.0–46.0)
Hemoglobin: 12.6 g/dL (ref 12.0–15.0)
MCH: 28.3 pg (ref 26.0–34.0)
MCHC: 33.8 g/dL (ref 30.0–36.0)
MCV: 83.8 fL (ref 78.0–100.0)
PLATELETS: 348 10*3/uL (ref 150–400)
RBC: 4.45 MIL/uL (ref 3.87–5.11)
RDW: 15 % (ref 11.5–15.5)
WBC: 10.4 10*3/uL (ref 4.0–10.5)

## 2016-10-07 LAB — TROPONIN I

## 2016-10-07 MED ORDER — GI COCKTAIL ~~LOC~~
30.0000 mL | Freq: Once | ORAL | Status: AC
  Administered 2016-10-07: 30 mL via ORAL
  Filled 2016-10-07: qty 30

## 2016-10-07 NOTE — ED Notes (Signed)
ED Provider at bedside discussing test results and dispo plan of care. 

## 2016-10-07 NOTE — ED Notes (Signed)
Attempted [place] IV in right AC.  Superficial vein using 22 gauge Cath.  Pt screamed and said she had never had a needle hurt her that much before.  Said the doctor had not told her she needed an IV.  Pt wanted a butterfly used to draw blood.  I explained to pt that the doctor had ordered an IV.  Pt repeated that the doctor didn't say that.  I told pt that I would find someone else to draw her blood.

## 2016-10-07 NOTE — Discharge Instructions (Signed)

## 2016-10-07 NOTE — ED Notes (Signed)
ED Provider at bedside. Dr. Long 

## 2016-10-07 NOTE — ED Notes (Signed)
ED Provider at bedside. 

## 2016-10-07 NOTE — ED Triage Notes (Addendum)
Chest pain. Gerd 2 days ago. She woke with palpitation and chest tightness that never got right. States she has not taken her medications for the day.

## 2016-10-07 NOTE — ED Provider Notes (Signed)
[place] DEPT [facility] Provider Note   CSN: [id number] Arrival date & time: 10/07/16  1957    History   Chief Complaint Chief Complaint  Patient presents with  . Chest Pain    HPI Sara Morton is a 76 y.o. female.  HPI  She presents to ED with chest discomfort. She states that she has known history of reflux. States that Sunday after she ate she started feeling like things were not digesting like they usually do. She felt like things are getting stuck. Patient endorses his symptoms have continued on until today. Chest discomfort mostly occurs after she eats. Feels maybe she has had some palpitations as well. No significant PMH other than HTN and anxiety. Having this chest discomfort has caused her to have some increased anxiety so she decided to come into the ED to be checked out. Chest pain is localized to the center of her chest. She denies any radiating symptoms. Denies any shortness of breath, fevers, chills, vomiting, diarrhea.    Past Medical History:  Diagnosis Date  . Hypertension     There are no active problems to display for this patient.   Past Surgical History:  Procedure Laterality Date  . ABDOMINAL HYSTERECTOMY    . JOINT REPLACEMENT      OB History    No data available      Home Medications    Prior to Admission medications   Medication Sig Start Date End Date Taking? Authorizing Provider  Ascorbic Acid (VITAMIN C PO) Take 1 tablet by mouth daily.   Yes [provider]  lisinopril (PRINIVIL,ZESTRIL) 10 MG tablet Take 10 mg by mouth daily.   Yes [provider]  meclizine (ANTIVERT) 25 MG tablet Take 25 mg by mouth 3 (three) times daily as needed.   Yes [provider]  metoprolol (LOPRESSOR) 50 MG tablet Take 50 mg by mouth 2 (two) times daily.   Yes [provider]  triamterene-hydrochlorothiazide (MAXZIDE-25) 37.5-25 MG per tablet Take 1 tablet by mouth daily.   Yes [provider]  albuterol (PROVENTIL  HFA;VENTOLIN HFA) 108 (90 BASE) MCG/ACT inhaler Inhale 1-2 puffs into the lungs every 6 (six) hours as needed for wheezing. 01/23/12 01/22/13  Rancour, Annie Main, MD  cephALEXin (KEFLEX) 500 MG capsule Take 500 mg by mouth 2 (two) times daily.    [provider]  diazepam (VALIUM) 5 MG tablet Take 0.5-1 tablets (2.5-5 mg total) by mouth every 8 (eight) hours as needed (for vertigo; may cause drowsiness). 01/05/13   Molpus, John, MD  ezetimibe-simvastatin (VYTORIN) 10-40 MG per tablet Take 1 tablet by mouth at bedtime.    [provider]  HYDROcodone-acetaminophen (NORCO/VICODIN) 5-325 MG per tablet Take 2 tablets by mouth every 4 (four) hours as needed. 09/17/12   Fransico Meadow, PA-C  LORazepam (ATIVAN) 1 MG tablet Take 0.5-1 mg by mouth 2 (two) times daily as needed. For rest or anxiety    [provider]  Polyethyl Glycol-Propyl Glycol (SYSTANE OP) Apply 1 drop to eye 2 (two) times daily as needed. For dry eyes     [provider]    Family History No family history on file.  Social History Social History  Substance Use Topics  . Smoking status: Never Smoker  . Smokeless tobacco: Never Used  . Alcohol use No    Allergies   Patient has no known allergies.  Review of Systems Review of Systems  Constitutional: Positive for diaphoresis. Negative for fever.  Respiratory: Positive  for chest tightness. Negative for shortness of breath.   Cardiovascular: Positive for chest pain and palpitations.  Gastrointestinal: Negative for abdominal pain.  Also per HPI  Physical Exam Updated Vital Signs BP (!) 165/93   Pulse 93   Temp 98.9 F (37.2 C) (Oral)   Resp 18   Ht 5' 5.5" (1.664 m)   Wt 82.1 kg   SpO2 99%   BMI 29.66 kg/m   Physical Exam  Constitutional: She appears well-developed and well-nourished. No distress.  HENT:  Head: Normocephalic and atraumatic.  Mouth/Throat: Oropharynx is clear and moist.  Eyes: Conjunctivae and EOM are normal.  Pupils are equal, round, and reactive to light.  Neck: Normal range of motion. Neck supple.  Cardiovascular: Normal rate, regular rhythm, normal heart sounds and intact distal pulses.   Pulmonary/Chest: Effort normal and breath sounds normal. No respiratory distress. She has no wheezes. She exhibits no tenderness.  Abdominal: Soft. She exhibits no distension. There is tenderness in the epigastric area. There is no guarding.  Musculoskeletal: Normal range of motion. She exhibits no edema.  Neurological: She is alert. No cranial nerve deficit.  Skin: She is diaphoretic.  Psychiatric: Her mood appears anxious.     ED Treatments / Results  Labs (all labs ordered are listed, but only abnormal results are displayed) Labs Reviewed  BASIC METABOLIC PANEL - Abnormal; Notable for the following:       Result Value   Calcium 8.8 (*)    All other components within normal limits  CBC  TROPONIN I    EKG  EKG Interpretation  Date/Time:  Tuesday Oct 07 2016 20:06:20 EDT Ventricular Rate:  93 PR Interval:    QRS Duration: 80 QT Interval:  370 QTC Calculation: 460 R Axis:   -49 Text Interpretation:  NSR Left anterior fascicular block Anterior infarct , age undetermined Abnormal ECG No STEMI.  Confirmed by LONG MD, JOSHUA [phone]) on 10/07/2016 8:21:12 PM       Radiology Dg Chest 2 View  Result Date: 10/07/2016 CLINICAL DATA:  Chest pain.  Chest tightness. EXAM: CHEST  2 VIEW COMPARISON:  Radiographs and CT 09/17/2012 FINDINGS: Unchanged heart size, upper limits normal. Unchanged thoracic aortic tortuosity. The lungs are clear. Small pulmonary nodules on prior chest CT are not visualized radiographically. Pulmonary vasculature is normal. No consolidation, pleural effusion, or pneumothorax. No acute osseous abnormalities are seen. IMPRESSION: No acute abnormality or change from prior exam. Electronically Signed   By: Jeb Levering M.D.   On: 10/07/2016 21:34    Procedures Procedures  (including critical care time)  Medications Ordered in ED Medications  gi cocktail (Maalox,Lidocaine,Donnatal) (30 mLs Oral Given 10/07/16 2133)     Initial Impression / Assessment and Plan / ED Course  I have reviewed the triage vital signs and the nursing notes.  Pertinent labs & imaging results that were available during my care of the patient were reviewed by me and considered in my medical decision making (see chart for details).  Patient with precordial chest pain. Story is slightly concerning for ACS in elderly female. Heart score is a 4. Patient with known GERD this is most likely diagnosis as symptoms are worsened with eating. Given GI cocktail in ED with some improvement. ECG with NSR, no STEMI. Labs were all normal. Troponin negative. Had risk benefit discussion with patient about hospital observation for chest pain. She declined admission. Patient given return precautions and warning signs. Encouraged her to follow-up with cardiology; number provided.   Final  Clinical Impressions(s) / ED Diagnoses   Final diagnoses:  Precordial chest pain    New Prescriptions New Prescriptions   No medications on file     Katheren Shams, DO 10/08/16 1229    Margette Fast, MD 10/08/16 [phone]

## 2018-07-19 ENCOUNTER — Emergency Department (HOSPITAL_BASED_OUTPATIENT_CLINIC_OR_DEPARTMENT_OTHER): Payer: Medicare HMO

## 2018-07-19 ENCOUNTER — Emergency Department (HOSPITAL_BASED_OUTPATIENT_CLINIC_OR_DEPARTMENT_OTHER)
Admission: EM | Admit: 2018-07-19 | Discharge: 2018-07-19 | Disposition: A | Discharge status: Home / Self Care | Payer: Medicare HMO | Attending: Emergency Medicine | Admitting: Emergency Medicine

## 2018-07-19 ENCOUNTER — Other Ambulatory Visit: Payer: Self-pay

## 2018-07-19 ENCOUNTER — Encounter (HOSPITAL_BASED_OUTPATIENT_CLINIC_OR_DEPARTMENT_OTHER): Payer: Self-pay

## 2018-07-19 DIAGNOSIS — I10 Essential (primary) hypertension: Secondary | ICD-10-CM | POA: Insufficient documentation

## 2018-07-19 DIAGNOSIS — Z79899 Other long term (current) drug therapy: Secondary | ICD-10-CM | POA: Insufficient documentation

## 2018-07-19 DIAGNOSIS — M5442 Lumbago with sciatica, left side: Secondary | ICD-10-CM | POA: Diagnosis not present

## 2018-07-19 DIAGNOSIS — Z8505 Personal history of malignant neoplasm of liver: Secondary | ICD-10-CM | POA: Insufficient documentation

## 2018-07-19 DIAGNOSIS — M545 Low back pain: Secondary | ICD-10-CM | POA: Diagnosis present

## 2018-07-19 DIAGNOSIS — F17220 Nicotine dependence, chewing tobacco, uncomplicated: Secondary | ICD-10-CM | POA: Insufficient documentation

## 2018-07-19 HISTORY — DX: Malignant neoplasm of liver, not specified as primary or secondary: C22.9

## 2018-07-19 MED ORDER — HYDROCODONE-ACETAMINOPHEN 5-325 MG PO TABS
1.0000 | ORAL_TABLET | Freq: Once | ORAL | Status: AC
  Administered 2018-07-19: 1 via ORAL
  Filled 2018-07-19: qty 1

## 2018-07-19 MED ORDER — HYDROCODONE-ACETAMINOPHEN 5-325 MG PO TABS: 1.0000 | ORAL_TABLET | Freq: Four times a day (QID) | ORAL | 0 refills | Status: DC | PRN

## 2018-07-19 MED FILL — HYDROCODON-APAP 5-325: 5-325 | 4 days supply | Qty: 14 | Fill #0

## 2018-07-19 NOTE — ED Triage Notes (Signed)
C/o lower back pain that radiates down left LE x 1 month-denies injury-NAD-steady gait

## 2018-07-19 NOTE — ED Provider Notes (Signed)
[facility] EMERGENCY DEPARTMENT Provider Note   CSN: [id number] Arrival date & time: 07/19/18  1202    History   Chief Complaint Chief Complaint  Patient presents with  . Back Pain    HPI Sara Morton is a 78 y.o. female.     Patient with complaint of lumbar back pain for a month.  Radiates down the lateral aspect of her left leg.  Goes down to about the level of the ankle.  No numbness or weakness to the left foot.  No falls.  Patient states that he got worse in the last 2 days.  No incontinence.  Right leg without any difficulties.     Past Medical History:  Diagnosis Date  . Hypertension   . Liver cancer ([place])     There are no active problems to display for this patient.   Past Surgical History:  Procedure Laterality Date  . ABDOMINAL HYSTERECTOMY    . HERNIA REPAIR    . JOINT REPLACEMENT    . LIVER SURGERY       OB History   No obstetric history on file.      Home Medications    Prior to Admission medications   Medication Sig Start Date End Date Taking? Authorizing Provider  lisinopril (PRINIVIL,ZESTRIL) 10 MG tablet Take 10 mg by mouth daily.   Yes [provider]  metoprolol (LOPRESSOR) 50 MG tablet Take 50 mg by mouth 2 (two) times daily.   Yes [provider]  triamterene-hydrochlorothiazide (MAXZIDE-25) 37.5-25 MG per tablet Take 1 tablet by mouth daily.   Yes [provider]  albuterol (PROVENTIL HFA;VENTOLIN HFA) 108 (90 BASE) MCG/ACT inhaler Inhale 1-2 puffs into the lungs every 6 (six) hours as needed for wheezing. 01/23/12 01/22/13  Rancour, Annie Main, MD  Ascorbic Acid (VITAMIN C PO) Take 1 tablet by mouth daily.    [provider]  cephALEXin (KEFLEX) 500 MG capsule Take 500 mg by mouth 2 (two) times daily.    [provider]  diazepam (VALIUM) 5 MG tablet Take 0.5-1 tablets (2.5-5 mg total) by mouth every 8 (eight) hours as needed (for vertigo; may cause drowsiness). 01/05/13   Molpus, John,  MD  ezetimibe-simvastatin (VYTORIN) 10-40 MG per tablet Take 1 tablet by mouth at bedtime.    [provider]  HYDROcodone-acetaminophen (NORCO/VICODIN) 5-325 MG per tablet Take 2 tablets by mouth every 4 (four) hours as needed. 09/17/12   Fransico Meadow, PA-C  HYDROcodone-acetaminophen (NORCO/VICODIN) 5-325 MG tablet Take 1 tablet by mouth every 6 (six) hours as needed for moderate pain. 07/19/18   Fredia Sorrow, MD  LORazepam (ATIVAN) 1 MG tablet Take 0.5-1 mg by mouth 2 (two) times daily as needed. For rest or anxiety    [provider]  meclizine (ANTIVERT) 25 MG tablet Take 25 mg by mouth 3 (three) times daily as needed.    [provider]  Polyethyl Glycol-Propyl Glycol (SYSTANE OP) Apply 1 drop to eye 2 (two) times daily as needed. For dry eyes     [provider]    Family History No family history on file.  Social History Social History   Tobacco Use  . Smoking status: Never Smoker  . Smokeless tobacco: Current User    Types: Chew  Substance Use Topics  . Alcohol use: No  . Drug use: No     Allergies   Patient has no known allergies.   Review of Systems Review of Systems  Constitutional: Negative for  chills and fever.  HENT: Negative for rhinorrhea and sore throat.   Eyes: Negative for visual disturbance.  Respiratory: Negative for cough and shortness of breath.   Cardiovascular: Negative for chest pain and leg swelling.  Gastrointestinal: Negative for abdominal pain, diarrhea, nausea and vomiting.  Genitourinary: Negative for dysuria.  Musculoskeletal: Positive for back pain. Negative for neck pain.  Skin: Negative for rash.  Neurological: Negative for dizziness, light-headedness and headaches.  Hematological: Does not bruise/bleed easily.  Psychiatric/Behavioral: Negative for confusion.     Physical Exam Updated Vital Signs BP (!) 142/75 (BP Location: Right Arm)   Pulse 71   Temp 97.7 F (36.5 C) (Oral)   Resp 18    Ht 1.676 m (5\' 6" )   Wt 87.1 kg   SpO2 99%   BMI 30.99 kg/m   Physical Exam Vitals signs and nursing note reviewed.  Constitutional:      General: She is not in acute distress.    Appearance: Normal appearance. She is well-developed.  HENT:     Head: Normocephalic and atraumatic.     Mouth/Throat:     Mouth: Mucous membranes are moist.  Eyes:     Extraocular Movements: Extraocular movements intact.     Conjunctiva/sclera: Conjunctivae normal.     Pupils: Pupils are equal, round, and reactive to light.  Neck:     Musculoskeletal: Normal range of motion and neck supple.  Cardiovascular:     Rate and Rhythm: Normal rate and regular rhythm.     Heart sounds: Normal heart sounds. No murmur.  Pulmonary:     Effort: Pulmonary effort is normal. No respiratory distress.     Breath sounds: Normal breath sounds.  Abdominal:     General: Bowel sounds are normal.     Palpations: Abdomen is soft.     Tenderness: There is no abdominal tenderness.  Musculoskeletal: Normal range of motion.        General: No swelling, tenderness or deformity.     Comments: Patient dorsalis pedis pulses are 1+ on both feet.  Good range of motion of the toes sensory intact.  Patient does have some pain in the low back with movement of the left leg.  No significant swelling to either leg.  Skin:    General: Skin is warm and dry.     Capillary Refill: Capillary refill takes less than 2 seconds.  Neurological:     Mental Status: She is alert and oriented to person, place, and time.     Cranial Nerves: No cranial nerve deficit.     Sensory: No sensory deficit.     Motor: No weakness.      ED Treatments / Results  Labs (all labs ordered are listed, but only abnormal results are displayed) Labs Reviewed - No data to display  EKG None  Radiology Dg Lumbar Spine 2-3 Views  Result Date: 07/19/2018 CLINICAL DATA:  Low back pain for 1 month.  No known injury. EXAM: LUMBAR SPINE - 2-3 VIEW COMPARISON:  CT  abdomen and pelvis 09/23/2017. FINDINGS: Mild convex right scoliosis and trace anterolisthesis L3 on L4 are seen. Marked loss of disc space height and endplate spurring are seen at all levels of the lumbar spine and imaged thoracic spine. Paraspinous structures demonstrate atherosclerosis. IMPRESSION: No acute abnormality. Advanced multilevel degenerative disease. Atherosclerosis. Electronically Signed   By: Inge Rise M.D.   On: 07/19/2018 14:50    Procedures Procedures (including critical care time)  Medications Ordered in ED Medications  HYDROcodone-acetaminophen (NORCO/VICODIN) 5-325 MG per tablet 1 tablet (1 tablet Oral Given 07/19/18 1418)     Initial Impression / Assessment and Plan / ED Course  I have reviewed the triage vital signs and the nursing notes.  Pertinent labs & imaging results that were available during my care of the patient were reviewed by me and considered in my medical decision making (see chart for details).        Work-up for the lumbar back pain that radiates into the left leg without any bony abnormalities on x-rays.  Symptoms are consistent with sciatic presentation except for the pain is more lateral on the leg.  No numbness or weakness to the toes on that side.  Patient improved here some with hydrocodone.  Patient later requested a prescription for benzo diazepam's to help her with her anxiety.  Chart review shows that she has not been on any recently.  Narcotic screen was checked on the computer database and patient's last scripts were more than 6 months ago.  She will be treated here with hydrocodone short course follow-up with her doctor.  Final Clinical Impressions(s) / ED Diagnoses   Final diagnoses:  Acute left-sided low back pain with left-sided sciatica    ED Discharge Orders         Ordered    HYDROcodone-acetaminophen (NORCO/VICODIN) 5-325 MG tablet  Every 6 hours PRN     07/19/18 1544           Fredia Sorrow, MD 07/19/18  1549

## 2018-07-19 NOTE — ED Notes (Signed)
Left leg hurts when she moves. No pain if laying still.

## 2018-07-19 NOTE — Discharge Instructions (Addendum)
Take the pain medication as directed.  Make an appointment to follow-up with your doctor.  If symptoms persist you will need MRI of your back.  X-rays today of the back showed no fracture but there are a lot of degenerative changes which could explain the pain.

## 2019-02-19 ENCOUNTER — Encounter (HOSPITAL_BASED_OUTPATIENT_CLINIC_OR_DEPARTMENT_OTHER): Payer: Self-pay

## 2019-02-19 ENCOUNTER — Emergency Department (HOSPITAL_BASED_OUTPATIENT_CLINIC_OR_DEPARTMENT_OTHER): Payer: Medicare HMO

## 2019-02-19 ENCOUNTER — Other Ambulatory Visit: Payer: Self-pay

## 2019-02-19 ENCOUNTER — Emergency Department (HOSPITAL_BASED_OUTPATIENT_CLINIC_OR_DEPARTMENT_OTHER)
Admission: EM | Admit: 2019-02-19 | Discharge: 2019-02-19 | Disposition: A | Discharge status: Home / Self Care | Payer: Medicare HMO | Attending: Emergency Medicine | Admitting: Emergency Medicine

## 2019-02-19 ENCOUNTER — Other Ambulatory Visit (HOSPITAL_BASED_OUTPATIENT_CLINIC_OR_DEPARTMENT_OTHER): Payer: Medicare HMO

## 2019-02-19 DIAGNOSIS — Z8505 Personal history of malignant neoplasm of liver: Secondary | ICD-10-CM | POA: Diagnosis not present

## 2019-02-19 DIAGNOSIS — M542 Cervicalgia: Secondary | ICD-10-CM | POA: Diagnosis present

## 2019-02-19 DIAGNOSIS — S46912D Strain of unspecified muscle, fascia and tendon at shoulder and upper arm level, left arm, subsequent encounter: Secondary | ICD-10-CM

## 2019-02-19 DIAGNOSIS — Z79899 Other long term (current) drug therapy: Secondary | ICD-10-CM | POA: Insufficient documentation

## 2019-02-19 DIAGNOSIS — S8001XD Contusion of right knee, subsequent encounter: Secondary | ICD-10-CM | POA: Insufficient documentation

## 2019-02-19 DIAGNOSIS — S53402D Unspecified sprain of left elbow, subsequent encounter: Secondary | ICD-10-CM | POA: Diagnosis not present

## 2019-02-19 DIAGNOSIS — I10 Essential (primary) hypertension: Secondary | ICD-10-CM | POA: Diagnosis not present

## 2019-02-19 MED ORDER — ACETAMINOPHEN 500 MG PO TABS
500.0000 mg | ORAL_TABLET | Freq: Once | ORAL | Status: AC
  Administered 2019-02-19: 500 mg via ORAL
  Filled 2019-02-19: qty 1

## 2019-02-19 MED ORDER — METHOCARBAMOL 500 MG PO TABS: 500.0000 mg | ORAL_TABLET | Freq: Four times a day (QID) | ORAL | 0 refills | Status: DC | PRN

## 2019-02-19 MED ORDER — CYCLOBENZAPRINE HCL 5 MG PO TABS
5.0000 mg | ORAL_TABLET | Freq: Once | ORAL | Status: AC
  Administered 2019-02-19: 5 mg via ORAL
  Filled 2019-02-19: qty 1

## 2019-02-19 NOTE — Discharge Instructions (Signed)
Recommend taking Tylenol, newly prescribed muscle relaxer as well as your previously prescribed tramadol as needed for pain control.  If you develop difficulty breathing, worsening neck pain, or other new concerning symptom recommend returning to ER for reassessment.  Otherwise recommend following up with your primary doctor this coming week for a recheck.
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
## 2019-06-26 ENCOUNTER — Emergency Department (HOSPITAL_BASED_OUTPATIENT_CLINIC_OR_DEPARTMENT_OTHER): Payer: Medicare HMO

## 2019-06-26 ENCOUNTER — Other Ambulatory Visit: Payer: Self-pay

## 2019-06-26 ENCOUNTER — Emergency Department (HOSPITAL_BASED_OUTPATIENT_CLINIC_OR_DEPARTMENT_OTHER)
Admission: EM | Admit: 2019-06-26 | Discharge: 2019-06-26 | Disposition: A | Discharge status: Home / Self Care | Payer: Medicare HMO | Attending: Emergency Medicine | Admitting: Emergency Medicine

## 2019-06-26 DIAGNOSIS — Z20822 Contact with and (suspected) exposure to covid-19: Secondary | ICD-10-CM | POA: Insufficient documentation

## 2019-06-26 DIAGNOSIS — Z8505 Personal history of malignant neoplasm of liver: Secondary | ICD-10-CM | POA: Insufficient documentation

## 2019-06-26 DIAGNOSIS — J069 Acute upper respiratory infection, unspecified: Secondary | ICD-10-CM | POA: Insufficient documentation

## 2019-06-26 DIAGNOSIS — J40 Bronchitis, not specified as acute or chronic: Secondary | ICD-10-CM | POA: Insufficient documentation

## 2019-06-26 DIAGNOSIS — R519 Headache, unspecified: Secondary | ICD-10-CM | POA: Diagnosis present

## 2019-06-26 DIAGNOSIS — I10 Essential (primary) hypertension: Secondary | ICD-10-CM | POA: Insufficient documentation

## 2019-06-26 MED ORDER — FLUTICASONE PROPIONATE 50 MCG/ACT NA SUSP: 2.0000 | Freq: Every day | NASAL | 0 refills | Status: DC

## 2019-06-26 NOTE — Discharge Instructions (Signed)
You were seen in the emergency department today with bronchitis type symptoms.  I am retesting you for COVID-19.  The result will come back in 1 to 2 days in the MyChart app.  There is information in the discharge paperwork about how to set this up on your phone and follow the results.  You will be called if your result is positive.  Please continue to remain in isolation.  You can use the Flonase as needed for nasal congestion and face pain.  You may take Tylenol as needed for discomfort.  Return to the emergency department chest pain, shortness of breath, or other sudden severe symptoms.

## 2019-06-26 NOTE — ED Provider Notes (Signed)
Emergency Department Provider Note   I have reviewed the triage vital signs and the nursing notes.   HISTORY  Chief Complaint Headache   HPI Sara Morton is a 79 y.o. female with PMH of HTN and active breast cancer undergoing radiation therapy presents to the emergency department with continued headache, tinnitus, face pain.  She has had symptoms for the past week and was seen at outside urgent care.  She had a rapid/antigen Covid test which was negative and diagnosed presumably with sinus infection.  She has been started on antibiotic with minimal improvement in symptoms.  She continues to have sinus pressure along with headache, right ear pain, tinnitus.  She denies fevers or chills.  She is also developed cough and congestion type symptoms.  She denies abdominal pain or vomiting.  No diarrhea.  No COVID-19 contacts.  She is not undergoing chemotherapy.   Past Medical History:  Diagnosis Date  . Hypertension   . Liver cancer ([place])     There are no problems to display for this patient.   Past Surgical History:  Procedure Laterality Date  . ABDOMINAL HYSTERECTOMY    . HERNIA REPAIR    . JOINT REPLACEMENT    . LIVER SURGERY      Allergies Metformin  No family history on file.  Social History Social History   Tobacco Use  . Smoking status: Never Smoker  . Smokeless tobacco: Current User    Types: Chew  Substance Use Topics  . Alcohol use: No  . Drug use: No    Review of Systems  Constitutional: No fever/chills Eyes: No visual changes. ENT: No sore throat. Positive congestion and sinus pressure. Positive tinnitus.  Cardiovascular: Denies chest pain. Respiratory: Denies shortness of breath. Positive cough.  Gastrointestinal: No abdominal pain.  No nausea, no vomiting.  No diarrhea.  No constipation. Genitourinary: Negative for dysuria. Musculoskeletal: Negative for back pain. Skin: Negative for rash. Neurological: Negative for focal weakness or numbness.  Positive HA.   10-point ROS otherwise negative.  ____________________________________________   PHYSICAL EXAM:  VITAL SIGNS: ED Triage Vitals  Enc Vitals Group     BP 06/26/19 1310 115/71     Pulse Rate 06/26/19 1310 (!) 111     Resp 06/26/19 1310 16     Temp 06/26/19 1310 98.1 F (36.7 C)     Temp Source 06/26/19 1310 Oral     SpO2 06/26/19 1310 100 %     Weight 06/26/19 1308 180 lb (81.6 kg)     Height 06/26/19 1308 5\' 5"  (1.651 m)   Constitutional: Alert and oriented. Well appearing and in no acute distress. Eyes: Conjunctivae are normal. PERRL.  Head: Atraumatic. Ears:  Healthy appearing ear canals and TMs bilaterally Nose: Mild congestion/rhinnorhea. Mouth/Throat: Mucous membranes are moist.  Oropharynx non-erythematous. Neck: No stridor. Cardiovascular: Normal rate, regular rhythm. Good peripheral circulation. Grossly normal heart sounds.   Respiratory: Normal respiratory effort.  No retractions. Lungs CTAB. Gastrointestinal: Soft and nontender. No distention.  Musculoskeletal: No lower extremity tenderness nor edema. No gross deformities of extremities. Neurologic:  Normal speech and language. No gross focal neurologic deficits are appreciated.  Skin:  Skin is warm, dry and intact. No rash noted.  ____________________________________________   LABS (all labs ordered are listed, but only abnormal results are displayed)  Labs Reviewed  NOVEL CORONAVIRUS, NAA (HOSP ORDER, SEND-OUT TO REF LAB; TAT 18-24 HRS)   ____________________________________________  RADIOLOGY  CT Head Wo Contrast  Result Date: 06/26/2019 CLINICAL DATA:  Headache and ear pain. The patient was diagnosed with a sinus infection 06/23/2019. EXAM: CT HEAD WITHOUT CONTRAST TECHNIQUE: Contiguous axial images were obtained from the base of the skull through the vertex without intravenous contrast. COMPARISON:  Head CT 02/13/2019. FINDINGS: Brain: No evidence of acute infarction, hemorrhage,  hydrocephalus, extra-axial collection or mass lesion/mass effect. There is some chronic microvascular ischemic disease. Vascular: No hyperdense vessel or unexpected calcification. Skull: Intact.  No focal lesion. Sinuses/Orbits: Status post cataract surgery. Otherwise unremarkable. Other: None. IMPRESSION: No acute abnormality. Chronic microvascular ischemic change. Electronically Signed   By: Inge Rise M.D.   On: 06/26/2019 14:03   DG Chest Portable 1 View  Result Date: 06/26/2019 CLINICAL DATA:  Headache and ear pain. The patient was diagnosed with a sinus infection 06/23/2019. EXAM: PORTABLE CHEST 1 VIEW COMPARISON:  Single view of the chest and CT chest 05/10/2019. FINDINGS: Lungs clear. Heart size normal. No pneumothorax or pleural fluid. No acute or focal bony abnormality. IMPRESSION: Negative chest. Electronically Signed   By: Inge Rise M.D.   On: 06/26/2019 14:05    ____________________________________________   PROCEDURES  Procedure(s) performed:   Procedures  None ____________________________________________   INITIAL IMPRESSION / ASSESSMENT AND PLAN / ED COURSE  Pertinent labs & imaging results that were available during my care of the patient were reviewed by me and considered in my medical decision making (see chart for details).   Patient with history of active breast cancer presents with persistent headache, ear pain, tinnitus.  She does have some mild URI symptoms.  No fever.  She is not visibly short of breath and her lung sounds are clear.  She is not experiencing chest pain she does have mild tachycardia on arrival.  No symptoms consistent with PE.  Doubt pneumonia clinically.  Will send PCR Covid testing with more congestion type symptoms.  Likely viral URI but with active breast cancer, tinnitus, persistent headaches I will obtain a screening CT scan of the head to evaluate for large/obvious masses with edema and/or bleeding although my suspicion for this is  low.   CT head and CXR reviewed. No sinusitis visible on CT. No masses or bleeding. Plan for PCR COVID testing and supportive care meds at home with close PCP follow up plan and ED return precautions.  ____________________________________________  FINAL CLINICAL IMPRESSION(S) / ED DIAGNOSES  Final diagnoses:  Upper respiratory tract infection, unspecified type  Bronchitis     NEW OUTPATIENT MEDICATIONS STARTED DURING THIS VISIT:  Discharge Medication List as of 06/26/2019  2:36 PM    START taking these medications   Details  fluticasone (FLONASE) 50 MCG/ACT nasal spray Place 2 sprays into both nostrils daily for 7 days., Starting Sun 06/26/2019, Until Sun 07/03/2019, Normal        Note:  This document was prepared using Dragon voice recognition software and may include unintentional dictation errors.  Nanda Quinton, MD, [facility] Emergency Medicine    Tasneem Cormier, [facility], MD 06/26/19 [place]

## 2019-06-26 NOTE — ED Triage Notes (Signed)
Pt c/o HA, ear pain, was dx with sinus infection on Thursday. Pt c/o "feeling dizzy". Pt ambulatory to room, no s/s of distress. Pt also reports cough and sinus congestion, denies fever. Pt currently endorses radiation therapy for breast cancer

## 2019-06-27 LAB — NOVEL CORONAVIRUS, NAA (HOSP ORDER, SEND-OUT TO REF LAB; TAT 18-24 HRS): SARS-CoV-2, NAA: NOT DETECTED

## 2021-07-02 ENCOUNTER — Emergency Department (INDEPENDENT_AMBULATORY_CARE_PROVIDER_SITE_OTHER)
Admission: EM | Admit: 2021-07-02 | Discharge: 2021-07-02 | Disposition: A | Discharge status: Home / Self Care | Payer: Medicare HMO | Source: Home / Self Care

## 2021-07-02 ENCOUNTER — Other Ambulatory Visit: Payer: Self-pay

## 2021-07-02 ENCOUNTER — Encounter: Payer: Self-pay | Admitting: Emergency Medicine

## 2021-07-02 DIAGNOSIS — M7552 Bursitis of left shoulder: Secondary | ICD-10-CM | POA: Diagnosis not present

## 2021-07-02 DIAGNOSIS — M25512 Pain in left shoulder: Secondary | ICD-10-CM | POA: Diagnosis not present

## 2021-07-02 NOTE — ED Provider Notes (Addendum)
[facility]    CSN: [id number] Arrival date & time: 07/02/21  1952      History   Chief Complaint Chief Complaint  Patient presents with   Shoulder Pain    HPI Sara Morton is a 81 y.o. female.   HPI  Patient's had left shoulder pain for couple weeks.  It waxes and wanes.  Since yesterday has been severe.  She can hardly move her arm.  She states that tonight she could not face going to bed because of the shoulder pain.  She is here for evaluation and treatment.  She has had arthritis and knee replacements in both knees, cortisone shots in her back, cortisone shots in her knees.  She states that those have worked well for her in the past. She is compliant with her medical care.  He is on multiple blood pressure medication. No diabetes or concerns for high sugar  Past Medical History:  Diagnosis Date   Hypertension    Liver cancer ([place])     There are no problems to display for this patient.   Past Surgical History:  Procedure Laterality Date   ABDOMINAL HYSTERECTOMY     HERNIA REPAIR     JOINT REPLACEMENT     LIVER SURGERY      OB History   No obstetric history on file.      Home Medications    Prior to Admission medications   Medication Sig Start Date End Date Taking? Authorizing Provider  HYDROcodone-acetaminophen (NORCO/VICODIN) 5-325 MG tablet Take 1 tablet by mouth every 6 (six) hours as needed for moderate pain. 07/19/18  Yes Fredia Sorrow, MD  albuterol (PROVENTIL HFA;VENTOLIN HFA) 108 (90 BASE) MCG/ACT inhaler Inhale 1-2 puffs into the lungs every 6 (six) hours as needed for wheezing. 01/23/12 01/22/13  Rancour, Annie Main, MD  fluticasone (FLONASE) 50 MCG/ACT nasal spray Place 2 sprays into both nostrils daily for 7 days. 06/26/19 07/03/19  Long, Wonda Olds, MD  HYDROcodone-acetaminophen (NORCO/VICODIN) 5-325 MG per tablet Take 2 tablets by mouth every 4 (four) hours as needed. 09/17/12   Fransico Meadow, PA-C  lisinopril (PRINIVIL,ZESTRIL) 10 MG  tablet Take 10 mg by mouth daily.    [provider]  LORazepam (ATIVAN) 1 MG tablet Take 0.5-1 mg by mouth 2 (two) times daily as needed. For rest or anxiety    [provider]  meclizine (ANTIVERT) 25 MG tablet Take 25 mg by mouth 3 (three) times daily as needed.    [provider]  methocarbamol (ROBAXIN) 500 MG tablet Take 1 tablet (500 mg total) by mouth every 6 (six) hours as needed for muscle spasms. 02/19/19   Lucrezia Starch, MD  metoprolol (LOPRESSOR) 50 MG tablet Take 50 mg by mouth 2 (two) times daily.    [provider]  Polyethyl Glycol-Propyl Glycol (SYSTANE OP) Apply 1 drop to eye 2 (two) times daily as needed. For dry eyes     [provider]  triamterene-hydrochlorothiazide (MAXZIDE-25) 37.5-25 MG per tablet Take 1 tablet by mouth daily.    [provider]    Family History History reviewed. No pertinent family history.  Social History Social History   Tobacco Use   Smoking status: Never   Smokeless tobacco: Former    Types: Nurse, children's Use: Never used  Substance Use Topics   Alcohol use: No   Drug use: No     Allergies   Metformin, Statins, and Celecoxib   Review  of Systems Review of Systems See HPI  Physical Exam Triage Vital Signs ED Triage Vitals  Enc Vitals Group     BP 07/02/21 2002 (!) 150/80     Pulse Rate 07/02/21 2002 75     Resp 07/02/21 2002 16     Temp 07/02/21 2002 100.1 F (37.8 C)     Temp Source 07/02/21 2002 Oral     SpO2 07/02/21 2002 98 %     Weight 07/02/21 2005 179 lb 14.3 oz (81.6 kg)     Height 07/02/21 2005 5\' 5"  (1.651 m)     Head Circumference --      Peak Flow --      Pain Score --      Pain Loc --      Pain Edu? --      Excl. in [place]? --    No data found.  Updated Vital Signs BP (!) 150/80 (BP Location: Right Arm)    Pulse 75    Temp 100.1 F (37.8 C) (Oral)    Resp 16    Ht 5\' 5"  (1.651 m)    Wt 81.6 kg    SpO2 98%    BMI 29.94 kg/m       Physical Exam Constitutional:      General: She is not in acute distress.    Appearance: She is well-developed and normal weight.     Comments: Acutely uncomfortable  HENT:     Head: Normocephalic and atraumatic.     Mouth/Throat:     Comments: Mask is in place Eyes:     Conjunctiva/sclera: Conjunctivae normal.     Pupils: Pupils are equal, round, and reactive to light.  Cardiovascular:     Rate and Rhythm: Normal rate.  Pulmonary:     Effort: Pulmonary effort is normal. No respiratory distress.  Abdominal:     General: There is no distension.     Palpations: Abdomen is soft.  Musculoskeletal:        General: Normal range of motion.     Cervical back: Normal range of motion.     Comments: Left shoulder is globally tender.  Point of maximum tenderness is laterally underneath the acromion process.  Patient has some warmth.  Very little range of motion.  Skin:    General: Skin is warm and dry.  Neurological:     General: No focal deficit present.     Mental Status: She is alert.  Psychiatric:        Mood and Affect: Mood normal.        Behavior: Behavior normal.     Comments: Tearful     UC Treatments / Results  Labs (all labs ordered are listed, but only abnormal results are displayed) Labs Reviewed - No data to display  EKG   Radiology No results found.  Procedures Join Aspiration/Injection  Date/Time: 07/02/2021 8:33 PM Performed by: Raylene Everts, MD Authorized by: Raylene Everts, MD   Consent:    Consent obtained:  Verbal   Consent given by:  Patient   Risks discussed:  Pain   Alternatives discussed:  No treatment and alternative treatment Universal protocol:    Patient identity confirmed:  Verbally with patient and arm band Location:    Location:  Shoulder   Shoulder:  L subacromial bursa Anesthesia:    Anesthesia method:  Local infiltration   Local anesthetic:  Lidocaine 1% w/o epi Procedure details:    Preparation: Patient was  prepped and  draped in usual sterile fashion     Needle gauge:  56 G   Ultrasound guidance: no     Approach:  Posterior   Steroid injected: yes     Specimen collected: no   Post-procedure details:    Dressing:  Adhesive bandage   Procedure completion:  Tolerated well, no immediate complications Comments:     Cleansed with Betadine.  Marked appropriately.  Injected with 1 cc of Depo-Medrol 40 mg/cc with 2 cc of 1% lidocaine.  Postinjection care reviewed (including critical care time)  Medications Ordered in UC Medications - No data to display  Initial Impression / Assessment and Plan / UC Course  I have reviewed the triage vital signs and the nursing notes.  Pertinent labs & imaging results that were available during my care of the patient were reviewed by me and considered in my medical decision making (see chart for details).     Patient has an acute shoulder bursitis that is very painful with very little movement.  She is had positive results from cortisone shots in the past.  I gave her 1 cc of Depo-Medrol, 40 mg/cc into the subacromial space.  Instructions for postinjection care.  Follow-up with sports medicine Final Clinical Impressions(s) / UC Diagnoses   Final diagnoses:  Acute pain of left shoulder  Acute bursitis of left shoulder     Discharge Instructions      Continue Tylenol for pain. Take your hydrocodone if needed for severe pain.  This might help you sleep at night Put ice on your shoulder for 20 minutes tonight before you go to sleep.  Tomorrow put ice on your shoulder for 20 minutes every 3-4 hours.  Do not put heat on your shoulder right after a cortisone shot. Wear sling for shoulder comfort for the next 2 to 3 days.  Take your arm out of the sling and move it gently a couple times a day so it does not get stiff If you continue to have shoulder pain see Dr. Clearance Coots in [facility]   ED Prescriptions   None    PDMP not reviewed this encounter.    Raylene Everts, MD 07/02/21 2033    Raylene Everts, MD 07/02/21 2034

## 2021-07-02 NOTE — ED Triage Notes (Signed)
Tylenol at 1400 Left shoulder pain  Increases w/ movement

## 2021-07-02 NOTE — Discharge Instructions (Addendum)
Continue Tylenol for pain. Take your hydrocodone if needed for severe pain.  This might help you sleep at night Put ice on your shoulder for 20 minutes tonight before you go to sleep.  Tomorrow put ice on your shoulder for 20 minutes every 3-4 hours.  Do not put heat on your shoulder right after a cortisone shot. Wear sling for shoulder comfort for the next 2 to 3 days.  Take your arm out of the sling and move it gently a couple times a day so it does not get stiff If you continue to have shoulder pain see Dr. Clearance Coots in [facility]

## 2021-10-08 ENCOUNTER — Emergency Department (HOSPITAL_BASED_OUTPATIENT_CLINIC_OR_DEPARTMENT_OTHER)
Admission: EM | Admit: 2021-10-08 | Discharge: 2021-10-09 | Discharge status: Against Medical Advice | Payer: Medicare HMO | Attending: Emergency Medicine | Admitting: Emergency Medicine

## 2021-10-08 ENCOUNTER — Encounter (HOSPITAL_BASED_OUTPATIENT_CLINIC_OR_DEPARTMENT_OTHER): Payer: Self-pay | Admitting: Urology

## 2021-10-08 ENCOUNTER — Encounter: Payer: Self-pay | Admitting: Emergency Medicine

## 2021-10-08 ENCOUNTER — Emergency Department (HOSPITAL_BASED_OUTPATIENT_CLINIC_OR_DEPARTMENT_OTHER): Payer: Medicare HMO

## 2021-10-08 ENCOUNTER — Emergency Department (INDEPENDENT_AMBULATORY_CARE_PROVIDER_SITE_OTHER)
Admission: EM | Admit: 2021-10-08 | Discharge: 2021-10-08 | Disposition: A | Discharge status: Home / Self Care | Payer: Medicare HMO | Source: Home / Self Care

## 2021-10-08 ENCOUNTER — Other Ambulatory Visit: Payer: Self-pay

## 2021-10-08 DIAGNOSIS — R200 Anesthesia of skin: Secondary | ICD-10-CM | POA: Diagnosis not present

## 2021-10-08 DIAGNOSIS — Z79899 Other long term (current) drug therapy: Secondary | ICD-10-CM | POA: Diagnosis not present

## 2021-10-08 DIAGNOSIS — G459 Transient cerebral ischemic attack, unspecified: Secondary | ICD-10-CM | POA: Diagnosis not present

## 2021-10-08 DIAGNOSIS — I10 Essential (primary) hypertension: Secondary | ICD-10-CM | POA: Diagnosis not present

## 2021-10-08 DIAGNOSIS — R531 Weakness: Secondary | ICD-10-CM | POA: Diagnosis present

## 2021-10-08 LAB — CBC WITH DIFFERENTIAL/PLATELET
Abs Immature Granulocytes: 0.02 10*3/uL (ref 0.00–0.07)
Basophils Absolute: 0 10*3/uL (ref 0.0–0.1)
Basophils Relative: 1 %
Eosinophils Absolute: 0.3 10*3/uL (ref 0.0–0.5)
Eosinophils Relative: 4 %
HCT: 35.5 % — ABNORMAL LOW (ref 36.0–46.0)
Hemoglobin: 12 g/dL (ref 12.0–15.0)
Immature Granulocytes: 0 %
Lymphocytes Relative: 26 %
Lymphs Abs: 1.7 10*3/uL (ref 0.7–4.0)
MCH: 30.2 pg (ref 26.0–34.0)
MCHC: 33.8 g/dL (ref 30.0–36.0)
MCV: 89.2 fL (ref 80.0–100.0)
Monocytes Absolute: 0.6 10*3/uL (ref 0.1–1.0)
Monocytes Relative: 9 %
Neutro Abs: 3.8 10*3/uL (ref 1.7–7.7)
Neutrophils Relative %: 60 %
Platelets: 253 10*3/uL (ref 150–400)
RBC: 3.98 MIL/uL (ref 3.87–5.11)
RDW: 14.3 % (ref 11.5–15.5)
WBC: 6.3 10*3/uL (ref 4.0–10.5)
nRBC: 0 % (ref 0.0–0.2)

## 2021-10-08 LAB — BASIC METABOLIC PANEL WITH GFR
Anion gap: 6 (ref 5–15)
BUN: 15 mg/dL (ref 8–23)
CO2: 26 mmol/L (ref 22–32)
Calcium: 9.1 mg/dL (ref 8.9–10.3)
Chloride: 108 mmol/L (ref 98–111)
Creatinine, Ser: 0.81 mg/dL (ref 0.44–1.00)
GFR, Estimated: >60 mL/min
Glucose, Bld: 108 mg/dL — ABNORMAL HIGH (ref 70–99)
Potassium: 3.7 mmol/L (ref 3.5–5.1)
Sodium: 140 mmol/L (ref 135–145)

## 2021-10-08 LAB — POCT FASTING CBG KUC MANUAL ENTRY: POCT Glucose (KUC): 119 mg/dL — AB (ref 70–99)

## 2021-10-08 MED ORDER — ASPIRIN EC 325 MG PO TBEC
325.0000 mg | DELAYED_RELEASE_TABLET | Freq: Once | ORAL | Status: AC
  Administered 2021-10-08: 325 mg via ORAL
  Filled 2021-10-08: qty 1

## 2021-10-08 NOTE — ED Triage Notes (Signed)
Pt states her right foot started feeling numb today. States both her feet have felt numb in past at times but today was worse. Denies known DM  ?

## 2021-10-08 NOTE — ED Notes (Signed)
Patient transported to CT 

## 2021-10-08 NOTE — Discharge Instructions (Signed)
GO TO ER °

## 2021-10-08 NOTE — ED Triage Notes (Signed)
Pt sent from UC after c/o right foot numbness that started today.  States chronic toe numbness but whole foot got numb today at 1800 ?States sensation returned at this time  ?Denies any other symptoms ?Provider at bedside  ?

## 2021-10-08 NOTE — ED Provider Notes (Signed)
?[place] ? ? ? ?CSN: [id number] ?Arrival date & time: 10/08/21  1902 ? ? ?  ? ?History   ?Chief Complaint ?Chief Complaint  ?Patient presents with  ? Numbness  ?  Pt states her right foot started feeling numb today. States both her feet have felt numb in past at times but today it was worse.   ? ? ?HPI ?Sara Morton is a 81 y.o. female.  ? ?HPI ? ?Sara Morton 81 year old woman.  Multiple medical problems.  She sees her doctors regularly.  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  She gets sciatica at times.  Never has numbness because of this. ?She has had 2 oncology diagnoses.  She had a cholangiocarcinoma, gallbladder in 2018.  Breast carcinoma in 2020.  Both of these are in her opinion in remission. ?Her chart indicates past history of stroke.  She states this was found on an imaging study.  She states she does not have any stroke sequela numbness or weakness. ? ?Patient is here today because she states she was walking out of the pharmacy when she had the sudden onset of numbness and weakness in her right leg.  She states it felt like it was going to buckle.  She states her leg feels heavy and feels swollen.  She is able to walk.  No trouble with thinking or speaking.  No trouble with vision.  No trouble with arms hands dexterity. ? ?She has hypertension well controlled.  Hyperlipidemia last LDL was 126.  Not on statin therapy.  Visits are reviewed.  Her last family medicine examination was on September 18, 2021.  Active problem list at that time was: ?Hypertension ?Acid reflux disease ?History of cholangiocarcinoma ?Essential hypertension ?TIA ?AAA ?Anxiety ?Type 2 diabetes without complication ?Lumbar degenerative disc disease ?Primary malignant neoplasm and breast female ?Mixed hyperlipidemia ? ?Patient is COVID-vaccinated.  She had COVID in January 2023 ? ?Past Medical History:  ?Diagnosis Date  ? Hypertension   ? Liver cancer ([place])   ? ? ?There are no problems to  display for this patient. ? ? ?Past Surgical History:  ?Procedure Laterality Date  ? ABDOMINAL HYSTERECTOMY    ? HERNIA REPAIR    ? JOINT REPLACEMENT    ? LIVER SURGERY    ? ? ?OB History   ?No obstetric history on file. ?  ? ? ? ?Home Medications   ? ?Prior to Admission medications   ?Medication Sig Start Date End Date Taking? Authorizing Provider  ?lisinopril (ZESTRIL) 10 MG tablet Take 1 tablet by mouth daily. 09/18/21  Yes [provider]  ?albuterol (PROVENTIL HFA;VENTOLIN HFA) 108 (90 BASE) MCG/ACT inhaler Inhale 1-2 puffs into the lungs every 6 (six) hours as needed for wheezing. 01/23/12 01/22/13  Ezequiel Essex, MD  ?Difluprednate 0.05 % EMUL 1 drop 3 (three) times a day.    [provider]  ?fluticasone (FLONASE) 50 MCG/ACT nasal spray Place 2 sprays into both nostrils daily for 7 days. 06/26/19 07/03/19  LongWonda Olds, MD  ?HYDROcodone-acetaminophen (NORCO/VICODIN) 5-325 MG per tablet Take 2 tablets by mouth every 4 (four) hours as needed. 09/17/12   Fransico Meadow, PA-C  ?HYDROcodone-acetaminophen (NORCO/VICODIN) 5-325 MG tablet Take 1 tablet by mouth every 6 (six) hours as needed for moderate pain. 07/19/18   Fredia Sorrow, MD  ?letrozole [facility]) 2.5 MG tablet Take 2.5 mg by mouth daily. 07/24/21   [provider]  ?lisinopril (PRINIVIL,ZESTRIL) 10 MG tablet Take 10 mg  by mouth daily.    [provider]  ?LORazepam (ATIVAN) 1 MG tablet Take 0.5-1 mg by mouth 2 (two) times daily as needed. For rest or anxiety    [provider]  ?meclizine (ANTIVERT) 25 MG tablet Take 25 mg by mouth 3 (three) times daily as needed.    [provider]  ?methocarbamol (ROBAXIN) 500 MG tablet Take 1 tablet (500 mg total) by mouth every 6 (six) hours as needed for muscle spasms. 02/19/19   Lucrezia Starch, MD  ?metoprolol (LOPRESSOR) 50 MG tablet Take 50 mg by mouth 2 (two) times daily.    [provider]  ?Polyethyl Glycol-Propyl Glycol (SYSTANE OP) Apply 1  drop to eye 2 (two) times daily as needed. For dry eyes ?    [provider]  ?triamterene-hydrochlorothiazide (MAXZIDE-25) 37.5-25 MG per tablet Take 1 tablet by mouth daily.    [provider]  ? ? ?Family History ?History reviewed. No pertinent family history. ? ?Social History ?Social History  ? ?Tobacco Use  ? Smoking status: Never  ? Smokeless tobacco: Former  ?  Types: Chew  ?Vaping Use  ? Vaping Use: Never used  ?Substance Use Topics  ? Alcohol use: No  ? Drug use: No  ? ? ? ?Allergies   ?Metformin, Statins, and Celecoxib ? ? ?Review of Systems ?Review of Systems ?See HPI ? ?Physical Exam ?Triage Vital Signs ?ED Triage Vitals  ?Enc Vitals Group  ?   BP 10/08/21 1909 136/88  ?   Pulse Rate 10/08/21 1909 79  ?   Resp 10/08/21 1909 16  ?   Temp 10/08/21 1909 97.9 ?F (36.6 ?C)  ?   Temp Source 10/08/21 1909 Oral  ?   SpO2 10/08/21 1909 96 %  ?   Weight --   ?   Height --   ?   Head Circumference --   ?   Peak Flow --   ?   Pain Score 10/08/21 1910 0  ?   Pain Loc --   ?   Pain Edu? --   ?   Excl. in [place]? --   ? ?No data found. ? ?Updated Vital Signs ?BP 136/88 (BP Location: Right Arm)   Pulse 79   Temp 97.9 ?F (36.6 ?C) (Oral)   Resp 16   SpO2 96%  ? ?   ? ?Physical Exam ?Constitutional:   ?   General: She is not in acute distress. ?   Appearance: She is well-developed and normal weight.  ?   Comments: Pleasant.  Lucid.  Appears stated age  ?HENT:  ?   Head: Normocephalic and atraumatic.  ?Eyes:  ?   Conjunctiva/sclera: Conjunctivae normal.  ?   Pupils: Pupils are equal, round, and reactive to light.  ?Cardiovascular:  ?   Rate and Rhythm: Normal rate and regular rhythm.  ?   Heart sounds: Normal heart sounds.  ?Pulmonary:  ?   Effort: Pulmonary effort is normal. No respiratory distress.  ?   Breath sounds: Normal breath sounds.  ?Abdominal:  ?   General: There is no distension.  ?   Palpations: Abdomen is soft.  ?Musculoskeletal:     ?   General: Normal range of motion.  ?   Cervical back:  Normal range of motion.  ?   Comments: Tenderness in the L5-S1 area and to the right SI joint.  Limited range of motion of spine.  Slow guarded steps, slightly flexed posture  ?Skin: ?  General: Skin is warm and dry.  ?Neurological:  ?   Mental Status: She is alert.  ?   Sensory: Sensory deficit present.  ?   Motor: Weakness present.  ?   Comments: Patient has diminished reflexes knee and ankle.  She can straight leg bilaterally without any increase in discomfort.  She does have reported numbness in the right foot ankle up to almost the knee.  Knee strength appears normal.  [place] strength testing to knee extension.  Can flex and extend ankles.  ?Psychiatric:     ?   Mood and Affect: Mood normal.     ?   Behavior: Behavior normal.  ? ? ? ?UC Treatments / Results  ?Labs ?(all labs ordered are listed, but only abnormal results are displayed) ?Labs Reviewed  ?POCT FASTING [facility] MANUAL ENTRY - Abnormal; Notable for the following components:  ?    Result Value  ? POCT Glucose ([facility]) 119 (*)   ? All other components within normal limits  ? ? ?EKG ? ? ?Radiology ?No results found. ? ?Procedures ?Procedures (including critical care time) ? ?Medications Ordered in UC ?Medications - No data to display ? ?Initial Impression / Assessment and Plan / UC Course  ?I have reviewed the triage vital signs and the nursing notes. ? ?Pertinent labs & imaging results that were available during my care of the patient were reviewed by me and considered in my medical decision making (see chart for details). ? ?  ? ?I explained to the patient that with history of lumbar degenerative disc disease, multiple cancers, and a history of a TIA/stroke she needs to have further evaluation for the acute onset of numbness and weakness in her leg.  I does find this to her and her son.  I did offer to call an ambulance so that she can go by [facility] once on a stroke protocol.  Son states that he will take her directly to the emergency room they declined  ambulance testing.  I did explain that this could possibly be a stroke and could worsen.  She is stable for transport ?Final Clinical Impressions(s) / UC Diagnoses  ? ?Final diagnoses:  ?Right leg numbness  ? ? ? ?

## 2021-10-08 NOTE — ED Provider Notes (Signed)
?[facility] EMERGENC[facility] DEPARTMENT ?Provider Note ? ? ?CSN: [id number] ?Arrival date & time: 10/08/21  2001 ? ?  ? ?History ? ?Chief Complaint  ?Patient presents with  ? Numbness  ? ? ?Sara Morton is a 81 y.o. female. ? ?Patient here with numbness, weakness in the right lower extremity about 2 hours ago maybe lasted 10 to 15 minutes but she is unsure but not having symptoms anymore.  History of hypertension, cholangiocarcinoma.  Does not seem to be undergoing any chemotherapy.  Patient states that she was walking out of the store today when she felt like her right leg fell asleep.  Felt like she was going to fall.  She was able to get to her car.  Felt like her foot was asleep.  She denies any weakness in her arms, speech changes, vision changes.  She is by herself.  Eventually symptoms seem to have gone away and she was able to drive home.  She went to urgent care who sent her here for concern for TIA.  Patient states that she has tingling in her toes at times but today's event was different.  Denies any chest pain, shortness of breath, abdominal pain. ? ?The history is provided by the patient.  ? ?  ? ?Home Medications ?Prior to Admission medications   ?Medication Sig Start Date End Date Taking? Authorizing Provider  ?albuterol (PROVENTIL HFA;VENTOLIN HFA) 108 (90 BASE) MCG/ACT inhaler Inhale 1-2 puffs into the lungs every 6 (six) hours as needed for wheezing. 01/23/12 01/22/13  Rancour, Annie Main, MD  ?Difluprednate 0.05 % EMUL 1 drop 3 (three) times a day.    [provider]  ?fluticasone (FLONASE) 50 MCG/ACT nasal spray Place 2 sprays into both nostrils daily for 7 days. 06/26/19 07/03/19  LongWonda Olds, MD  ?HYDROcodone-acetaminophen (NORCO/VICODIN) 5-325 MG per tablet Take 2 tablets by mouth every 4 (four) hours as needed. 09/17/12   Fransico Meadow, PA-C  ?HYDROcodone-acetaminophen (NORCO/VICODIN) 5-325 MG tablet Take 1 tablet by mouth every 6 (six) hours as needed for moderate pain. 07/19/18    Fredia Sorrow, MD  ?letrozole [facility]) 2.5 MG tablet Take 2.5 mg by mouth daily. 07/24/21   [provider]  ?lisinopril (PRINIVIL,ZESTRIL) 10 MG tablet Take 10 mg by mouth daily.    [provider]  ?lisinopril (ZESTRIL) 10 MG tablet Take 1 tablet by mouth daily. 09/18/21   [provider]  ?LORazepam (ATIVAN) 1 MG tablet Take 0.5-1 mg by mouth 2 (two) times daily as needed. For rest or anxiety    [provider]  ?meclizine (ANTIVERT) 25 MG tablet Take 25 mg by mouth 3 (three) times daily as needed.    [provider]  ?methocarbamol (ROBAXIN) 500 MG tablet Take 1 tablet (500 mg total) by mouth every 6 (six) hours as needed for muscle spasms. 02/19/19   Lucrezia Starch, MD  ?metoprolol (LOPRESSOR) 50 MG tablet Take 50 mg by mouth 2 (two) times daily.    [provider]  ?Polyethyl Glycol-Propyl Glycol (SYSTANE OP) Apply 1 drop to eye 2 (two) times daily as needed. For dry eyes ?    [provider]  ?triamterene-hydrochlorothiazide (MAXZIDE-25) 37.5-25 MG per tablet Take 1 tablet by mouth daily.    [provider]  ?   ? ?Allergies    ?Metformin, Statins, and Celecoxib   ? ?Review of Systems   ?Review of Systems ? ?Physical Exam ?Updated Vital Signs ?BP (!) 164/70   Pulse 62  Temp 98 ?F (36.7 ?C) (Oral)   Resp 18   Ht '5\' 5"'$  (1.651 m)   Wt 81.6 kg   SpO2 99%   BMI 29.94 kg/m?  ?Physical Exam ?Vitals and nursing note reviewed.  ?Constitutional:   ?   General: She is not in acute distress. ?   Appearance: She is well-developed. She is not ill-appearing.  ?HENT:  ?   Head: Normocephalic and atraumatic.  ?Eyes:  ?   Extraocular Movements: Extraocular movements intact.  ?   Conjunctiva/sclera: Conjunctivae normal.  ?   Pupils: Pupils are equal, round, and reactive to light.  ?Cardiovascular:  ?   Rate and Rhythm: Normal rate and regular rhythm.  ?   Pulses: Normal pulses.  ?   Heart sounds: Normal heart sounds. No murmur heard. ?Pulmonary:   ?   Effort: Pulmonary effort is normal. No respiratory distress.  ?   Breath sounds: Normal breath sounds.  ?Abdominal:  ?   Palpations: Abdomen is soft.  ?   Tenderness: There is no abdominal tenderness.  ?Musculoskeletal:     ?   General: No swelling or tenderness.  ?   Cervical back: Normal range of motion and neck supple.  ?Skin: ?   General: Skin is warm and dry.  ?   Capillary Refill: Capillary refill takes less than 2 seconds.  ?Neurological:  ?   General: No focal deficit present.  ?   Mental Status: She is alert and oriented to person, place, and time.  ?   Cranial Nerves: No cranial nerve deficit.  ?   Sensory: No sensory deficit.  ?   Motor: No weakness.  ?   Coordination: Coordination normal.  ?   Gait: Gait normal.  ?   Comments: 5+ out of 5 strength throughout, normal sensation, no drift, normal finger-to-nose finger, normal speech, no visual field deficits  ?Psychiatric:     ?   Mood and Affect: Mood normal.  ? ? ?ED Results / Procedures / Treatments   ?Labs ?(all labs ordered are listed, but only abnormal results are displayed) ?Labs Reviewed  ?CBC WITH DIFFERENTIAL/PLATELET - Abnormal; Notable for the following components:  ?    Result Value  ? HCT 35.5 (*)   ? All other components within normal limits  ?BASIC METABOLIC PANEL - Abnormal; Notable for the following components:  ? Glucose, Bld 108 (*)   ? All other components within normal limits  ? ? ?EKG ?None ? ?Radiology ?CT Head Wo Contrast ? ?Result Date: 10/08/2021 ?CLINICAL DATA:  Numbness.  Possible transient ischemic attack. EXAM: CT HEAD WITHOUT CONTRAST TECHNIQUE: Contiguous axial images were obtained from the base of the skull through the vertex without intravenous contrast. RADIATION DOSE REDUCTION: This exam was performed according to the departmental dose-optimization program which includes automated exposure control, adjustment of the mA and/or kV according to patient size and/or use of iterative reconstruction technique. COMPARISON:   Head CT dated 08/07/2019. FINDINGS: Brain: The ventricles and sulci are appropriate size for patient's age. Mild periventricular and deep white matter chronic microvascular ischemic changes noted. There is no acute intracranial hemorrhage. No mass effect or midline shift. No extra-axial fluid collection. Vascular: No hyperdense vessel or unexpected calcification. Skull: Normal. Negative for fracture or focal lesion. Sinuses/Orbits: No acute finding. Other: Small left forehead contusion. IMPRESSION: 1. No acute intracranial pathology. 2. Mild chronic microvascular ischemic changes. Electronically Signed   By: Anner Crete M.D.   On: 10/08/2021 20:53   ? ?Procedures ?  Procedures  ? ? ?Medications Ordered in ED ?Medications - No data to display ? ?ED Course/ Medical Decision Making/ A&P ?  ?                        ?Medical Decision Making ?Amount and/or Complexity of Data Reviewed ?Labs: ordered. ?Radiology: ordered. ? ?Risk ?Decision regarding hospitalization. ? ? ?Bindi T Sugrue is here after an episode of right leg weakness and numbness.  History of hypertension.  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  Patient with mildly elevated blood pressure upon arrival but otherwise normal vitals.  States that about 2 hours ago she was walking out of a grocery store when she started to have some tingling and numbness in her right foot and felt like her right leg was heavy.  She is by herself.  She felt like she was going to fall but was able to get to her car.  Symptoms lasted maybe 10 to 15 minutes but she is not necessarily sure.  Eventually she is able to drive home and have a family member take her to an urgent care who sent there for further stroke evaluation.  Patient denies any back pain during the episode.  Denied any pain or discomfort in the leg during the episode.  She states that she is got some intermittent chronic tingling in both of her toes which I suspect is a chronic neuropathy but  she is adamant that this was a different feeling that she is having in her right leg.  Her neuro exam is normal at this time.  She is got good pulses in her lower extremities.  She is not having any pain in her

## 2021-10-09 NOTE — Discharge Instructions (Signed)
Please start taking baby aspirin every day. ? ?As discussed, our concern is that you either had a small stroke or an event that is a harbinger of a future stroke.  We wanted to admit you to the hospital to prevent future large stroke events. ? ?You have preferred going home now.  In this case, we recommend that you call the neurologist at the number provided to set up an appointment in 2 weeks. ? ?Please call 911 immediately if you start having worsening symptoms, new numbness, new weakness, slurred speech, vision changes, severe dizziness. ?

## 2021-10-09 NOTE — ED Notes (Signed)
Pt insists on leaving AMA due to extended wait time for an inpatient bed. RN apologized for the wait time and informed patient of the risks of leaving without further workup. Niece and son at bedside attempting to encourage pt to stay. Pt also insisted on her IV be removed due to pain in area. Iv was flushed and redressed in attempt to relieve pain. IV was found to be patent. Pt still insists on IV to be removed. IV removed from RUA. Pt states "I'll stay for a little while longer." Pt ambulatory with steady gait to restroom. Pt placed back in bed. All questions/concerns addressed at this time.   ?

## 2021-10-09 NOTE — ED Notes (Addendum)
Son Acupuncturist) and granddaughter Karsten Fells) requests call when inpatient bed is approved.  ?

## 2021-10-09 NOTE — ED Notes (Signed)
Pt care taken, in bed , no complaints at this time 

## 2021-10-09 NOTE — ED Provider Notes (Signed)
?  Physical Exam  ?BP 120/89   Pulse 65   Temp 98.2 ?F (36.8 ?C)   Resp 17   Ht '5\' 5"'$  (1.651 m)   Wt 81.6 kg   SpO2 100%   BMI 29.94 kg/m?  ? ?Physical Exam ? ?Procedures  ?Procedures ? ?ED Course / MDM  ?  ?Medical Decision Making ?Amount and/or Complexity of Data Reviewed ?Labs: ordered. ?Radiology: ordered. ? ?Risk ?OTC drugs. ? ? ?Nursing staff requested to assess the patient.  The patient wants to leave AMA.  She still works at a [employer], she has been in the ED overnight and would prefer just being home and following up as an outpatient. ? ?I went and reassessed the patient.  She is indicating that she still having some numbness over her right foot right now. ? ?I reviewed patient's initial ED note.  She had come in with episodes of right-sided weakness and numbness that lasted for few minutes.  She is being admitted for TIA. ? ?At this time, patient wants to leave AMA.  ? ?Patient wants to leave against medical advice. ?Patient understands that her actions will lead to inadequate medical workup, and that she is at risk of complications of missed diagnosis, which includes morbidity and mortality. ? ?Alternative options discussed : Waiting few more hours to wait for bed availability, as we expect patient to be discharged in the morning. ?Opportunity to change mind given, ?Discussion witnessed by RN. ?Patient is demonstrating good capacity to make decision. ?Patient understands that she needs to return to the ER immediately if her symptoms get worse. ? ?Patient is determined to go home.  She does not take aspirin.  I have asked her to start taking baby aspirin every day.  We will also give her neurology follow-up information.  Finally, patient will call 911 if she starts having worsening symptoms or new neurologic symptoms. ? ? ? ? ? ?  ?Varney Biles, MD ?10/09/21 [phone] ? ?

## 2021-10-09 NOTE — Progress Notes (Signed)
Plan of Care Note for accepted transfer ? ? ?Patient: Sara Morton MRN: [id number]   DOA: 10/08/2021 ? ?Facility requesting transfer: [facility] ED ?Requesting Provider: Dr. Ronnald Nian ?Reason for transfer: TIA ?Facility course:  ? ?81 year old female with past medical history of Hypertension, history of cholangiocarcinoma (2018), Breast Cancer (2020), chronic low back pain, peripheral neuropathy presenting to [facility] emergency department with complaints of right lower extremity numbness and weakness. ? ?Patient explains that yesterday afternoon the patient was at the pharmacy store the experienced an episode of right leg numbness and weakness while walking to the car.  They report that their leg "felt heavy."  Symptoms lasted for approximately 15 minutes before completely resolving. ? ?Patient went with family to a [place] urgent care clinic where they were instructed to go to [facility] emergency department for further evaluation. ? ?Upon evaluation in the emergency department noncontrast CT imaging of the head revealed no acute intracranial pathology.  ER provider discussed case with Dr. Lorrin Goodell who recommended hospitalization for TIA work-up. ? ?Plan of care: ?The patient is accepted for admission to Telemetry unit, at [facility]..  ? ? ?Author: ?Vernelle Emerald, MD ?10/09/2021 ? ?Check [email or web address] for on-call coverage. ? ?Nursing staff, Please call [facility] number on Amion as soon as patient's arrival, so appropriate admitting provider can evaluate the pt. ?

## 2021-10-09 NOTE — ED Notes (Signed)
Pt reports feeling better with persistent tingling to right leg . Assisted pt to restroom , ambulated with steady gait with minimal assistance . ?Pt questing leaving home, sts want return to work and to come back to ED if feeling worse . ?Discussed the risk of leaving against medical advice.  ?EDP made aware.  ?

## 2021-10-21 ENCOUNTER — Ambulatory Visit: Payer: Medicare HMO | Admitting: Diagnostic Neuroimaging

## 2021-10-21 ENCOUNTER — Encounter: Payer: Self-pay | Admitting: Diagnostic Neuroimaging

## 2021-10-21 VITALS — BP 116/65 | HR 60 | Ht 65.0 in | Wt 177.0 lb

## 2021-10-21 DIAGNOSIS — G459 Transient cerebral ischemic attack, unspecified: Secondary | ICD-10-CM

## 2021-10-21 MED ORDER — ROSUVASTATIN CALCIUM 10 MG PO TABS: 10.0000 mg | ORAL_TABLET | Freq: Every day | ORAL | 6 refills | Status: AC

## 2021-10-21 NOTE — Patient Instructions (Addendum)
  TRANSIENT RIGHT FOOT, LEG NUMBNESS / WEAKNESS (1-2 hours)  - check MRI brain, carotid u/s, echocardiogram  - continue aspirin '81mg'$  daily  - prior statin intolerance, but not clear; will try low dose rosuvastatin '10mg'$

## 2021-10-21 NOTE — Progress Notes (Signed)
[facility]  PATIENT: Sara Morton DOB: 1941-01-30  REFERRING CLINICIAN: No ref. provider found HISTORY FROM: patient  REASON FOR VISIT: new consult    HISTORICAL  CHIEF COMPLAINT:  Chief Complaint  Patient presents with   New Patient (Initial Visit)    Rm 6 here for consult on possible TIA- pt reports event took place on 10/08/2021 went to the ER for work up. Pt reports since d/c numbness and tingling in bilateral feet has been prenst.     HISTORY OF PRESENT ILLNESS:   81 year old female here for evaluation of right leg and foot numbness.  10/08/2021 patient had sudden onset of numbness and weakness in the right foot and leg.  This started around 6 PM.  Symptoms lasted until about 7 PM.  She went to urgent care for evaluation and then went to the ER for further testing.  CT scan was unremarkable.  She was recommended to be admitted for stroke work-up but she wanted to follow-up in the clinic.  Since that time patient is doing well.  No recurrent symptoms.  Before this happened patient has had longstanding mild numbness and tingling in bilateral toes and feet.   REVIEW OF SYSTEMS: Full 14 system review of systems performed and negative with exception of: as per HPI.  ALLERGIES: Allergies  Allergen Reactions   Metformin Other (See Comments)    Hallucinations Hallucinations    Statins     Other reaction(s): Myalgias (intolerance)   Celecoxib Palpitations    HOME MEDICATIONS: Outpatient Medications Prior to Visit  Medication Sig Dispense Refill   Difluprednate 0.05 % EMUL 1 drop 3 (three) times a day.     letrozole (FEMARA) 2.5 MG tablet Take 2.5 mg by mouth daily.     lisinopril (ZESTRIL) 10 MG tablet Take 1 tablet by mouth daily.     meclizine (ANTIVERT) 25 MG tablet Take 25 mg by mouth 3 (three) times daily as needed.     metoprolol (LOPRESSOR) 50 MG tablet Take 50 mg by mouth 2 (two) times daily.     Polyethyl Glycol-Propyl Glycol (SYSTANE OP)  Apply 1 drop to eye 2 (two) times daily as needed. For dry eyes      triamterene-hydrochlorothiazide (MAXZIDE-25) 37.5-25 MG per tablet Take 1 tablet by mouth daily.     albuterol (PROVENTIL HFA;VENTOLIN HFA) 108 (90 BASE) MCG/ACT inhaler Inhale 1-2 puffs into the lungs every 6 (six) hours as needed for wheezing. 1 Inhaler 0   fluticasone (FLONASE) 50 MCG/ACT nasal spray Place 2 sprays into both nostrils daily for 7 days. 1 g 0   HYDROcodone-acetaminophen (NORCO/VICODIN) 5-325 MG per tablet Take 2 tablets by mouth every 4 (four) hours as needed. 16 tablet 0   HYDROcodone-acetaminophen (NORCO/VICODIN) 5-325 MG tablet Take 1 tablet by mouth every 6 (six) hours as needed for moderate pain. 14 tablet 0   lisinopril (PRINIVIL,ZESTRIL) 10 MG tablet Take 10 mg by mouth daily.     LORazepam (ATIVAN) 1 MG tablet Take 0.5-1 mg by mouth 2 (two) times daily as needed. For rest or anxiety     methocarbamol (ROBAXIN) 500 MG tablet Take 1 tablet (500 mg total) by mouth every 6 (six) hours as needed for muscle spasms. 20 tablet 0   No facility-administered medications prior to visit.    PAST MEDICAL HISTORY: Past Medical History:  Diagnosis Date   Hypertension    Liver cancer ([place])     PAST SURGICAL HISTORY: Past Surgical History:  Procedure Laterality Date  ABDOMINAL HYSTERECTOMY     HERNIA REPAIR     JOINT REPLACEMENT     LIVER SURGERY      FAMILY HISTORY: History reviewed. No pertinent family history.  SOCIAL HISTORY: Social History   Socioeconomic History   Marital status: Single    Spouse name: Not on file   Number of children: Not on file   Years of education: Not on file   Highest education level: Not on file  Occupational History   Not on file  Tobacco Use   Smoking status: Never   Smokeless tobacco: Former    Types: Nurse, children's Use: Never used  Substance and Sexual Activity   Alcohol use: No   Drug use: No   Sexual activity: Not on file  Other Topics  Concern   Not on file  Social History Narrative   Right handed    Some caffeine    Social Determinants of Health   Financial Resource Strain: Not on file  Food Insecurity: Not on file  Transportation Needs: Not on file  Physical Activity: Not on file  Stress: Not on file  Social Connections: Not on file  Intimate Partner Violence: Not on file     PHYSICAL EXAM   GENERAL EXAM/CONSTITUTIONAL: Vitals:  Vitals:   10/21/21 1409  BP: 116/65  Pulse: 60  Weight: 177 lb (80.3 kg)  Height: '5\' 5"'$  (1.651 m)   Body mass index is 29.45 kg/m. Wt Readings from Last 3 Encounters:  10/21/21 177 lb (80.3 kg)  10/08/21 179 lb 14.3 oz (81.6 kg)  07/02/21 179 lb 14.3 oz (81.6 kg)   Patient is in no distress; well developed, nourished and groomed; neck is supple  CARDIOVASCULAR: Examination of carotid arteries is normal; no carotid bruits Regular rate and rhythm, no murmurs Examination of peripheral vascular system by observation and palpation is normal  EYES: Ophthalmoscopic exam of optic discs and posterior segments is normal; no papilledema or hemorrhages No results found.  MUSCULOSKELETAL: Gait, strength, tone, movements noted in Neurologic exam below  NEUROLOGIC: MENTAL STATUS:      View : No data to display.         awake, alert, oriented to person, place and time recent and remote memory intact normal attention and concentration language fluent, comprehension intact, naming intact fund of knowledge appropriate  CRANIAL NERVE:  2nd - no papilledema on fundoscopic exam 2nd, 3rd, 4th, 6th - pupils equal and reactive to light, visual fields full to confrontation, extraocular muscles intact, no nystagmus 5th - facial sensation symmetric 7th - facial strength symmetric 8th - hearing intact 9th - palate elevates symmetrically, uvula midline 11th - shoulder shrug symmetric 12th - tongue protrusion midline  MOTOR:  normal bulk and tone, full strength in the BUE,  BLE  SENSORY:  normal and symmetric to light touch, temperature, vibration  COORDINATION:  finger-nose-finger, fine finger movements normal  REFLEXES:  deep tendon reflexes present and symmetric  GAIT/STATION:  narrow based gait     DIAGNOSTIC DATA (LABS, IMAGING, TESTING) - I reviewed patient records, labs, notes, testing and imaging myself where available.  Lab Results  Component Value Date   WBC 6.3 10/08/2021   HGB 12.0 10/08/2021   HCT 35.5 (L) 10/08/2021   MCV 89.2 10/08/2021   PLT 253 10/08/2021      Component Value Date/Time   NA 140 10/08/2021 2024   K 3.7 10/08/2021 2024   CL 108 10/08/2021 2024   CO2 26  10/08/2021 2024   GLUCOSE 108 (H) 10/08/2021 2024   BUN 15 10/08/2021 2024   CREATININE 0.81 10/08/2021 2024   CALCIUM 9.1 10/08/2021 2024   PROT 7.2 09/17/2012 2020   ALBUMIN 3.3 (L) 09/17/2012 2020   AST 12 09/17/2012 2020   ALT 9 09/17/2012 2020   ALKPHOS 77 09/17/2012 2020   BILITOT 0.4 09/17/2012 2020   GFRNONAA >60 10/08/2021 2024   GFRAA >60 10/07/2016 2125   No results found for: CHOL, HDL, LDLCALC, LDLDIRECT, TRIG, CHOLHDL No results found for: HGBA1C No results found for: VITAMINB12 No results found for: TSH  09/16/21 labs  Ref Range & Units 1 mo ago  Total Cholesterol 25 - 199 MG/DL 247 High    Triglycerides 10 - 150 MG/DL 153 High    HDL Cholesterol 35 - 135 MG/DL 57   LDL Cholesterol Calculated 0 - 99 MG/DL 159 High    Total Chol / HDL Cholesterol <4.5 4.3   Non-HDL Cholesterol MG/DL 190   Comment:  TARGET: <(LDL-C TARGET + 30)MG/DL   Ref Range & Units 1 mo ago   HEMOGLOBIN A1C <5.7 % 6.2 High    Comment: Normal:       Less than 5.7%  Prediabetes:  5.7% to 6.4%  Diabetes:     Greater than 6.4%   10/08/21 CT head [I reviewed images myself and agree with interpretation. -VRP]  1. No acute intracranial pathology. 2. Mild chronic microvascular ischemic changes.      ASSESSMENT AND PLAN  81 y.o. year old female here  with:   Dx:  1. TIA (transient ischemic attack)      PLAN:  TRANSIENT RIGHT FOOT, LEG NUMBNESS / WEAKNESS (1-2 hours; possible TIA) - check MRI brain, carotid u/s, echocardiogram - continue aspirin '81mg'$  daily - prior statin intolerance, but not clear; will try low dose rosuvastatin '10mg'$  (LDL 159!) - follow up with PCP for sugar and BP monitoring   Orders Placed This Encounter  Procedures   MR BRAIN WO CONTRAST   ECHOCARDIOGRAM COMPLETE BUBBLE STUDY   VAS US CAROTID    Meds ordered this encounter  Medications   rosuvastatin (CRESTOR) 10 MG tablet    Sig: Take 1 tablet (10 mg total) by mouth daily.    Dispense:  30 tablet    Refill:  6    Return for pending if symptoms worsen or fail to improve, pending test results.  I reviewed images, labs, notes, records myself. I summarized findings and reviewed with patient, for this high risk condition (TIA) requiring high complexity decision making.     Penni Bombard, MD 8/75/6433, 2:95 PM Certified in Neurology, Neurophysiology and Neuroimaging  [facility] [address], [place], [place] [address] [phone]

## 2021-11-01 ENCOUNTER — Telehealth: Payer: Self-pay | Admitting: Diagnostic Neuroimaging

## 2021-11-01 NOTE — Telephone Encounter (Signed)
[employer] carotid NPR echo Josem Kaufmann: [id number] exp. 11/06/21-02/04/22

## 2021-11-06 ENCOUNTER — Ambulatory Visit (HOSPITAL_BASED_OUTPATIENT_CLINIC_OR_DEPARTMENT_OTHER)
Admission: RE | Admit: 2021-11-06 | Discharge: 2021-11-06 | Disposition: A | Discharge status: Home / Self Care | Payer: Medicare HMO | Source: Ambulatory Visit | Attending: Diagnostic Neuroimaging | Admitting: Diagnostic Neuroimaging

## 2021-11-06 DIAGNOSIS — G459 Transient cerebral ischemic attack, unspecified: Secondary | ICD-10-CM | POA: Diagnosis not present

## 2021-11-06 LAB — ECHOCARDIOGRAM COMPLETE BUBBLE STUDY
AR max vel: 2.05 cm2
AV Area VTI: 2.58 cm2
AV Area mean vel: 2.17 cm2
AV Mean grad: 4 mmHg
AV Peak grad: 9.1 mmHg
Ao pk vel: 1.51 m/s
Area-P 1/2: 2.56 cm2
S' Lateral: 2.4 cm

## 2021-11-06 NOTE — Progress Notes (Signed)
  Echocardiogram 2D Echocardiogram has been performed.  Sara Morton F 11/06/2021, 4:17 PM

## 2021-11-07 ENCOUNTER — Ambulatory Visit (HOSPITAL_COMMUNITY)
Admission: RE | Admit: 2021-11-07 | Discharge: 2021-11-07 | Disposition: A | Discharge status: Home / Self Care | Payer: Medicare HMO | Source: Ambulatory Visit | Attending: Diagnostic Neuroimaging | Admitting: Diagnostic Neuroimaging

## 2021-11-07 DIAGNOSIS — G459 Transient cerebral ischemic attack, unspecified: Secondary | ICD-10-CM | POA: Diagnosis present

## 2021-11-22 ENCOUNTER — Telehealth: Payer: Self-pay

## 2022-12-07 IMAGING — CT CT HEAD W/O CM
3 series · 14 of 47 positions shown, 16 images · non-contrast
Comparison: Head CT dated 08/07/2019.

CLINICAL DATA: Numbness.  Possible transient ischemic attack.



[Series 2: head wo · axial · 0.45mm/px · z∈[+1022,+1152]mm · 8 of 32 slices shown, 10 images]
[im 3/32  brain]
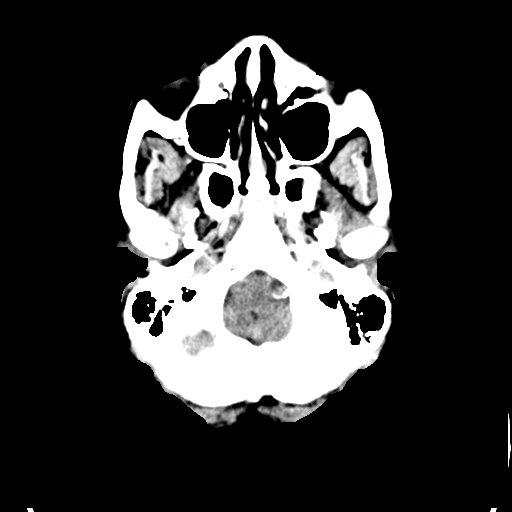
[im 3/32  bone]
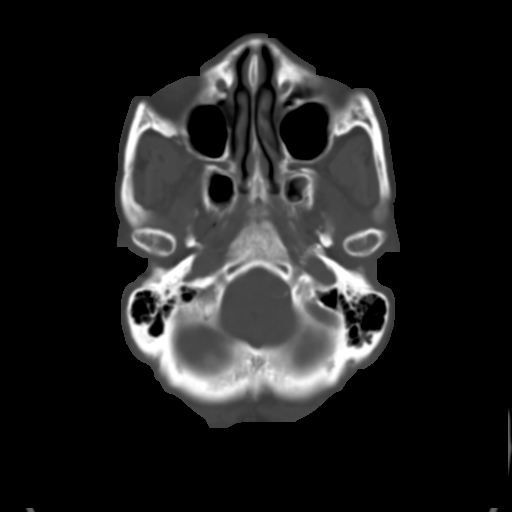
[im 7/32  brain]
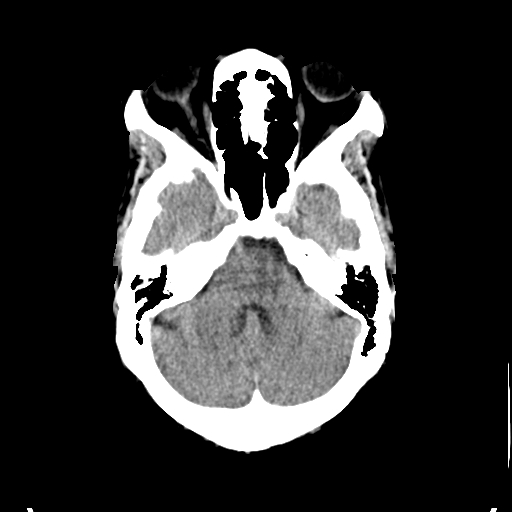
[im 10/32  brain]
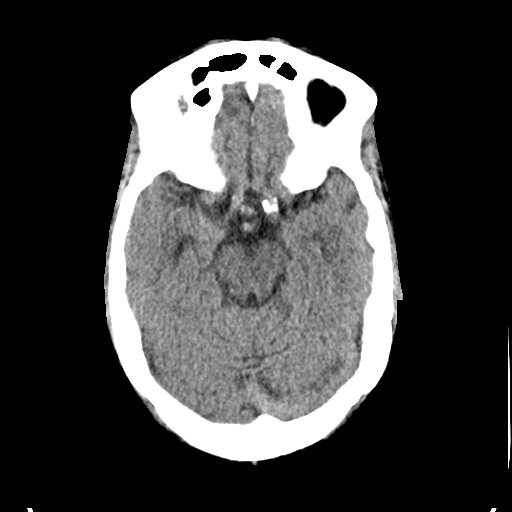
[im 14/32  brain]
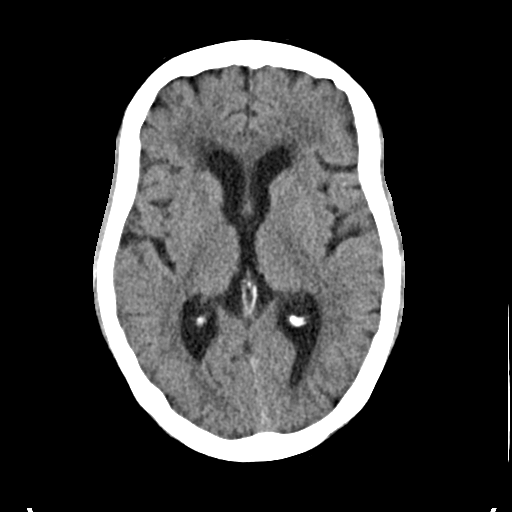
[im 18/32  brain]
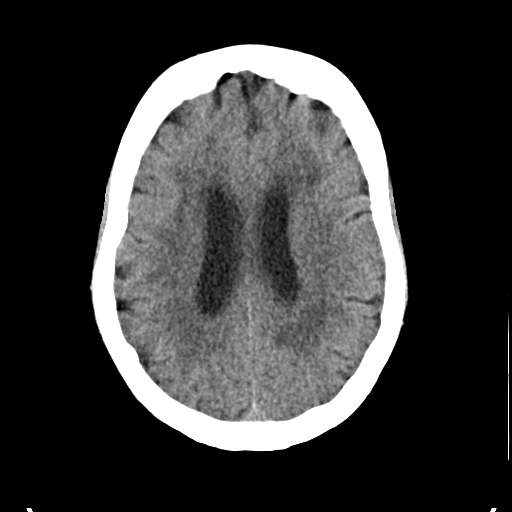
[im 18/32  bone]
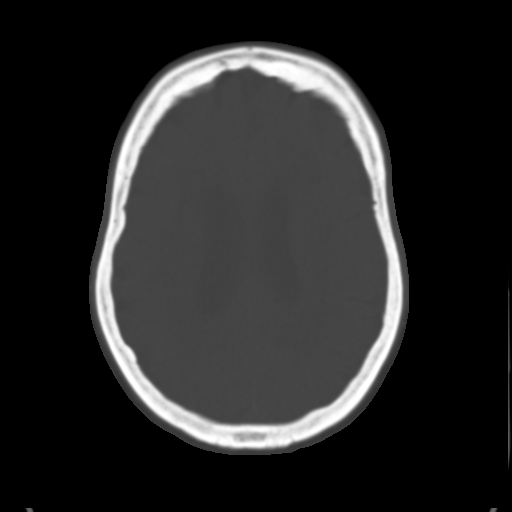
[im 22/32  brain]
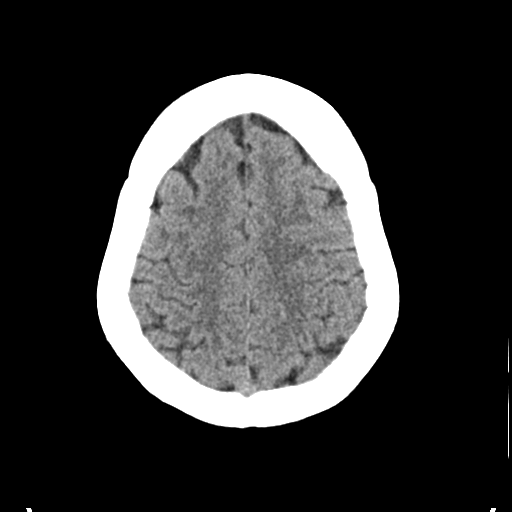
[im 25/32  brain]
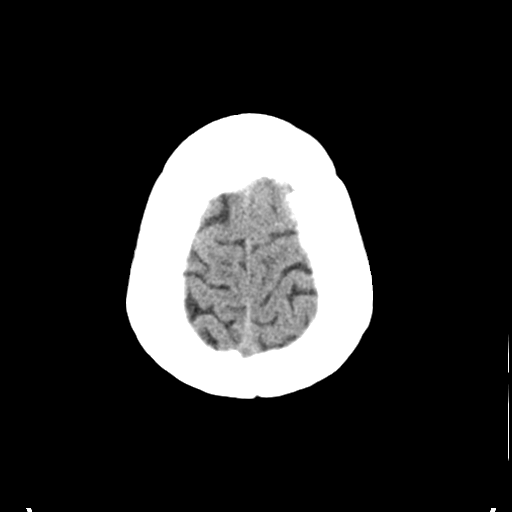
[im 29/32  brain]
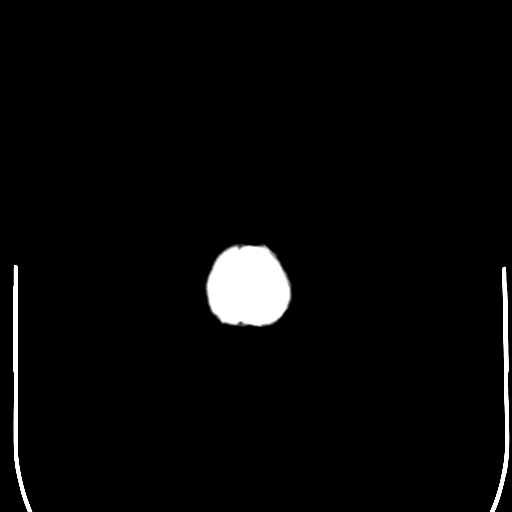

[Series 4: coronal soft · coronal · 0.31mm/px · 3 of 72 slices shown]
[im 24/72  brain]
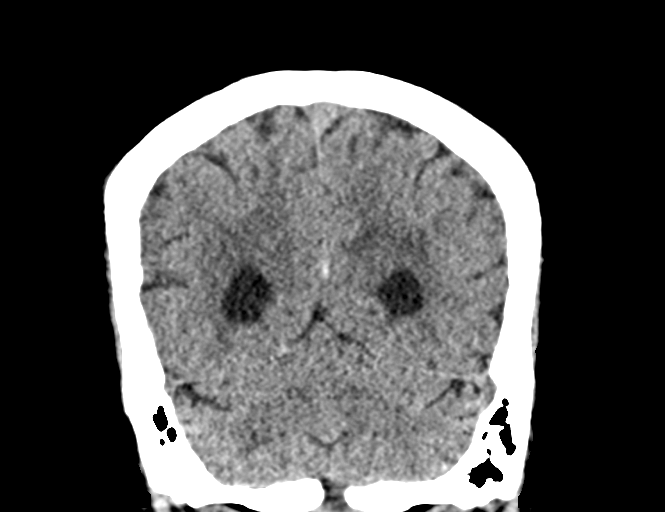
[im 32/72  brain]
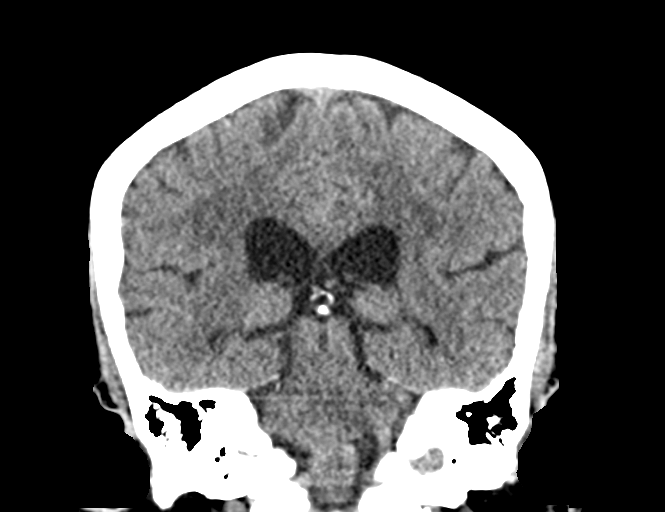
[im 40/72  brain]
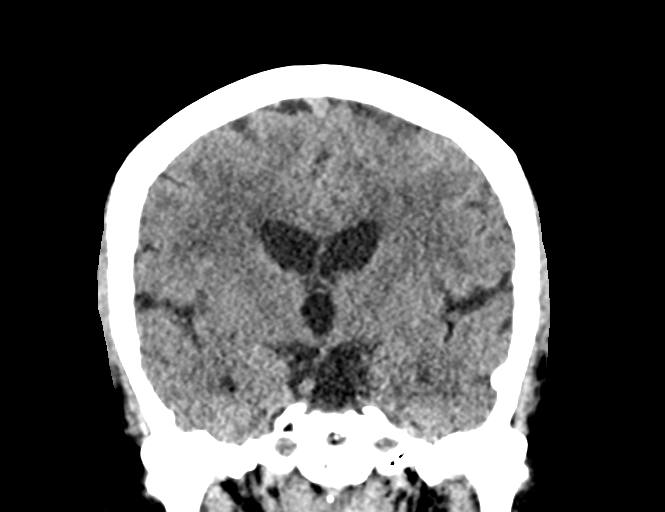

[Series 5: sag soft · sagittal · 0.31mm/px · 3 of 64 slices shown]
[im 22/64  brain]
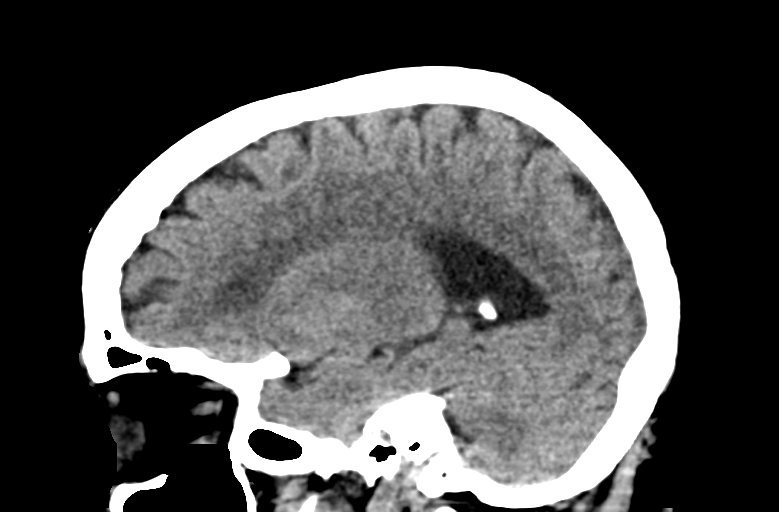
[im 32/64  brain]
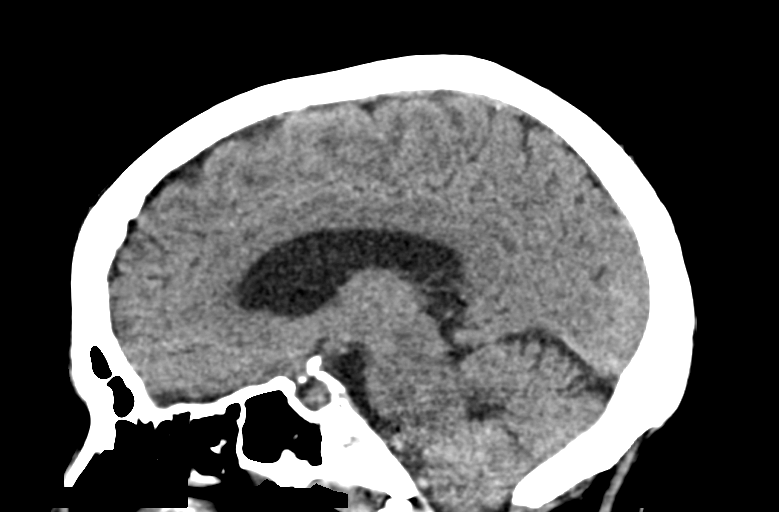
[im 43/64  brain]
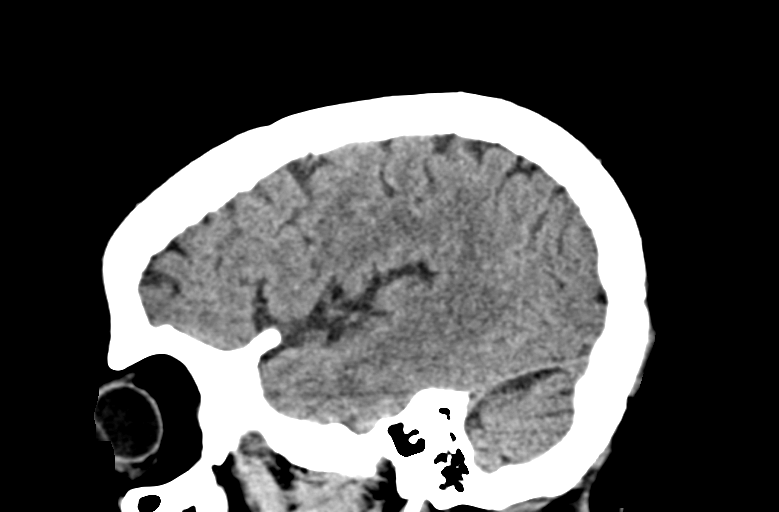

[14 of 47 positions shown; findings below may reference images not displayed]

FINDINGS: Brain: The ventricles and sulci are appropriate size for patient's
age. Mild periventricular and deep white matter chronic
microvascular ischemic changes noted. There is no acute intracranial
hemorrhage. No mass effect or midline shift. No extra-axial fluid
collection.

Vascular: No hyperdense vessel or unexpected calcification.

Skull: Normal. Negative for fracture or focal lesion.

Sinuses/Orbits: No acute finding.

Other: Small left forehead contusion.
IMPRESSION: 1. No acute intracranial pathology.
2. Mild chronic microvascular ischemic changes.

## 2023-01-16 ENCOUNTER — Other Ambulatory Visit: Payer: Self-pay | Admitting: Diagnostic Neuroimaging

## 2024-04-02 ENCOUNTER — Other Ambulatory Visit: Payer: Self-pay

## 2024-04-02 ENCOUNTER — Encounter (HOSPITAL_BASED_OUTPATIENT_CLINIC_OR_DEPARTMENT_OTHER): Payer: Self-pay | Admitting: Emergency Medicine

## 2024-04-02 ENCOUNTER — Emergency Department (HOSPITAL_BASED_OUTPATIENT_CLINIC_OR_DEPARTMENT_OTHER)
Admission: EM | Admit: 2024-04-02 | Discharge: 2024-04-02 | Disposition: A | Discharge status: Home / Self Care | Payer: Medicare (Managed Care) | Attending: Emergency Medicine | Admitting: Emergency Medicine

## 2024-04-02 ENCOUNTER — Emergency Department (HOSPITAL_BASED_OUTPATIENT_CLINIC_OR_DEPARTMENT_OTHER): Payer: Medicare (Managed Care)

## 2024-04-02 ENCOUNTER — Emergency Department (HOSPITAL_COMMUNITY): Payer: Medicare (Managed Care)

## 2024-04-02 DIAGNOSIS — R42 Dizziness and giddiness: Secondary | ICD-10-CM | POA: Insufficient documentation

## 2024-04-02 DIAGNOSIS — Z743 Need for continuous supervision: Secondary | ICD-10-CM | POA: Diagnosis not present

## 2024-04-02 DIAGNOSIS — I10 Essential (primary) hypertension: Secondary | ICD-10-CM | POA: Diagnosis not present

## 2024-04-02 DIAGNOSIS — G4489 Other headache syndrome: Secondary | ICD-10-CM | POA: Diagnosis not present

## 2024-04-02 LAB — CBC
HCT: 34.2 % — ABNORMAL LOW (ref 36.0–46.0)
Hemoglobin: 11.5 g/dL — ABNORMAL LOW (ref 12.0–15.0)
MCH: 30.3 pg (ref 26.0–34.0)
MCHC: 33.6 g/dL (ref 30.0–36.0)
MCV: 90.2 fL (ref 80.0–100.0)
Platelets: 240 K/uL (ref 150–400)
RBC: 3.79 MIL/uL — ABNORMAL LOW (ref 3.87–5.11)
RDW: 13.9 % (ref 11.5–15.5)
WBC: 5.7 K/uL (ref 4.0–10.5)
nRBC: 0 % (ref 0.0–0.2)

## 2024-04-02 LAB — URINALYSIS, W/ REFLEX TO CULTURE (INFECTION SUSPECTED)
Bilirubin Urine: NEGATIVE
Glucose, UA: NEGATIVE mg/dL
Hgb urine dipstick: NEGATIVE
Ketones, ur: NEGATIVE mg/dL
Nitrite: POSITIVE — AB
Protein, ur: NEGATIVE mg/dL
RBC / HPF: NONE SEEN RBC/hpf (ref 0–5)
Specific Gravity, Urine: 1.015 (ref 1.005–1.030)
Squamous Epithelial / HPF: NONE SEEN /HPF (ref 0–5)
pH: 7.5 (ref 5.0–8.0)

## 2024-04-02 LAB — BASIC METABOLIC PANEL WITH GFR
Anion gap: 9 (ref 5–15)
BUN: 14 mg/dL (ref 8–23)
CO2: 27 mmol/L (ref 22–32)
Calcium: 9.3 mg/dL (ref 8.9–10.3)
Chloride: 103 mmol/L (ref 98–111)
Creatinine, Ser: 0.91 mg/dL (ref 0.44–1.00)
GFR, Estimated: >60 mL/min (ref >60)
Glucose, Bld: 92 mg/dL (ref 70–99)
Potassium: 4.5 mmol/L (ref 3.5–5.1)
Sodium: 139 mmol/L (ref 135–145)

## 2024-04-02 LAB — TROPONIN T, HIGH SENSITIVITY: Troponin T High Sensitivity: <15 ng/L (ref 0–19)

## 2024-04-02 MED ORDER — IOHEXOL 350 MG/ML SOLN
75.0000 mL | Freq: Once | INTRAVENOUS | Status: AC | PRN
Start: 2024-04-02 — End: 2024-04-02
  Administered 2024-04-02: 75 mL via INTRAVENOUS

## 2024-04-02 MED ORDER — MECLIZINE HCL 25 MG PO TABS
25.0000 mg | ORAL_TABLET | Freq: Once | ORAL | Status: AC
Start: 2024-04-02 — End: 2024-04-02
  Administered 2024-04-02: 25 mg via ORAL
  Filled 2024-04-02: qty 1

## 2024-04-02 MED ORDER — LACTATED RINGERS IV BOLUS
500.0000 mL | Freq: Once | INTRAVENOUS | Status: AC
  Administered 2024-04-02: 500 mL via INTRAVENOUS

## 2024-04-02 MED ORDER — GADOBUTROL 1 MMOL/ML IV SOLN
8.0000 mL | Freq: Once | INTRAVENOUS | Status: AC | PRN
  Administered 2024-04-02: 8 mL via INTRAVENOUS

## 2024-04-02 MED ORDER — PROCHLORPERAZINE MALEATE 10 MG PO TABS
5.0000 mg | ORAL_TABLET | Freq: Once | ORAL | Status: AC
Start: 2024-04-02 — End: 2024-04-02
  Administered 2024-04-02: 5 mg via ORAL
  Filled 2024-04-02: qty 1

## 2024-04-02 MED ORDER — CEPHALEXIN 250 MG PO CAPS
500.0000 mg | ORAL_CAPSULE | Freq: Once | ORAL | Status: AC
Start: 2024-04-02 — End: 2024-04-02
  Administered 2024-04-02: 500 mg via ORAL
  Filled 2024-04-02: qty 2

## 2024-04-02 MED ORDER — CEPHALEXIN 500 MG PO CAPS: 500.0000 mg | ORAL_CAPSULE | Freq: Three times a day (TID) | ORAL | 0 refills | Status: AC

## 2024-04-02 NOTE — ED Notes (Signed)
 Discharge instructions reviewed.   Newly prescribed medications discussed. Pharmacy verified.   Opportunity for questions and concerns provided.   Alert, oriented and escorted to familys vehicle via wheelchair. SABRA   Displays no signs of distress.

## 2024-04-02 NOTE — ED Triage Notes (Signed)
 Pt c/o intermittent dizziness x 2 days; denies NV; c/o mild HA and ringing in ears

## 2024-04-02 NOTE — ED Notes (Signed)
 Attempted IV access to R & L AC's but unsuccessful. Tol well

## 2024-04-02 NOTE — ED Notes (Signed)
 MRI notified that pt is here.  They stated they will put in for transport, but that there are 2 more patients ahead of her.

## 2024-04-02 NOTE — Discharge Instructions (Addendum)
 Your MRI did not show any signs of stroke.  The urine sample did show signs of possible infection.  Dr Rogelia sent in a prescription for Keflex

## 2024-04-02 NOTE — ED Notes (Signed)
 Assisted pt stand-pivot to [facility] without difficulty.

## 2024-04-02 NOTE — ED Provider Notes (Signed)
 Patient remains stable here at this time.  Awaiting transfer to Sara Morton, ED for MRI.  1600.  Patient transported via critical care transport at this time   Sara Morton LABOR, DO 04/02/24 1603

## 2024-04-02 NOTE — ED Notes (Signed)
 Patient transported to MRI

## 2024-04-02 NOTE — ED Provider Notes (Signed)
 Patient was sent from [facility] ED to have an MRI for dizziness.  Patient remained stable.  Explained to her that I will review results with her after her MRI is complete.  Patient's MRI does not show any signs of acute abnormality.  There are chronic findings in the white matter associated with chronic small vessel disease  Discussed findings with patient.  She is ready to go home   Randol Simmonds, MD 04/02/24 [phone]

## 2024-04-02 NOTE — ED Provider Notes (Signed)
 [place] [facility] Provider Note   CSN: [id number] Arrival date & time: 04/02/24  1135     History Chief Complaint  Patient presents with   Dizziness    HPI: Sara Morton is a 83 y.o. female with history pertinent for vertigo on meclizine, prior TIA, chronic tinnitus who presents complaining of dizziness. Patient arrived via POV, driven in by son who is not at bedside at the time of initial evaluation.  History provided by patient.  No interpreter required during this encounter.  Patient reports that she has a history of chronic vertigo which she describes as the room spinning.  Reports that she has had symptoms consistent with her chronic vertigo intermittently for the past 2 days.  Reports that it is provoked by movement (not specifically rightward or leftward head movement) but rather getting up from a seated position.  Reports that she has tried her home meclizine and it has not provided relief, though notes that her meclizine does not always provide relief of her chronic vertigo.  Denies any difficulty moving or feeling part of her body, denies any difficulty with word finding.  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  Denies any chest pain, shortness of breath, fevers, chills, ear pain.  Patient's recorded medical, surgical, social, medication list and allergies were reviewed in the Snapshot window as part of the initial history.   Prior to Admission medications   Medication Sig Start Date End Date Taking? Authorizing Provider  cephALEXin (KEFLEX) 500 MG capsule Take 1 capsule (500 mg total) by mouth 3 (three) times daily for 5 days. 04/02/24 04/07/24 Yes Rogelia Jerilynn RAMAN, MD  Difluprednate 0.05 % EMUL 1 drop 3 (three) times a day.    [provider]  letrozole (FEMARA) 2.5 MG  tablet Take 2.5 mg by mouth daily. 07/24/21   [provider]  lisinopril (ZESTRIL) 10 MG tablet Take 1 tablet by mouth daily. 09/18/21   [provider]  meclizine (ANTIVERT) 25 MG tablet Take 25 mg by mouth 3 (three) times daily as needed.    [provider]  metoprolol (LOPRESSOR) 50 MG tablet Take 50 mg by mouth 2 (two) times daily.    [provider]  Polyethyl Glycol-Propyl Glycol (SYSTANE OP) Apply 1 drop to eye 2 (two) times daily as needed. For dry eyes     [provider]  rosuvastatin  (CRESTOR ) 10 MG tablet Take 1 tablet (10 mg total) by mouth daily. 10/21/21   Penumalli, Vikram R, MD  triamterene-hydrochlorothiazide (MAXZIDE-25) 37.5-25 MG per tablet Take 1 tablet by mouth daily.    [provider]     Allergies: Metformin, Statins, and Celecoxib   Review of Systems   ROS as per HPI  Physical Exam Updated Vital Signs BP (!) 165/68   Pulse (!) 51   Temp 98 F (36.7 C) (Oral)   Resp 16   Ht 5' 5 (1.651 m)   Wt 76.7 kg   SpO2 100%   BMI 28.12 kg/m  Physical Exam Vitals and nursing note reviewed.  Constitutional:      General: She is not in acute distress.    Appearance: Normal appearance.  HENT:     Head: Normocephalic and atraumatic.     Right Ear: Tympanic membrane normal.  Left Ear: Tympanic membrane normal.  Eyes:     General: No visual field deficit.    Extraocular Movements: Extraocular movements intact.     Pupils: Pupils are equal, round, and reactive to light.     Comments: No nystagmus  Cardiovascular:     Rate and Rhythm: Normal rate.     Pulses: Normal pulses.  Pulmonary:     Effort: Pulmonary effort is normal.  Skin:    General: Skin is warm and dry.     Capillary Refill: Capillary refill takes 2 to 3 seconds.  Neurological:     General: No focal deficit present.     Mental Status: She is alert and oriented to person, place, and time.     GCS: GCS eye subscore is 4. GCS verbal subscore is  5. GCS motor subscore is 6.     Cranial Nerves: Cranial nerves 2-12 are intact. No cranial nerve deficit, dysarthria or facial asymmetry.     Sensory: No sensory deficit.     Motor: No weakness, abnormal muscle tone or pronator drift.     Coordination: Finger-Nose-Finger Test and Heel to [employer] normal.     ED Course/ Medical Decision Making/ A&P    Procedures .Ultrasound ED Peripheral IV (Provider)  Date/Time: 04/02/2024 12:59 PM  Performed by: Rogelia Jerilynn RAMAN, MD Authorized by: Rogelia Jerilynn RAMAN, MD   Procedure details:    Indications: multiple failed IV attempts     Skin Prep: chlorhexidine gluconate     Location:  Right AC   Angiocath:  20 G   Bedside Ultrasound Guided: Yes     Images: not archived     Patient tolerated procedure without complications: Yes     Dressing applied: Yes      Medications Ordered in ED Medications  lactated ringers bolus 500 mL (0 mLs Intravenous Stopped 04/02/24 1515)  meclizine (ANTIVERT) tablet 25 mg (25 mg Oral Given 04/02/24 1234)  prochlorperazine (COMPAZINE) tablet 5 mg (5 mg Oral Given 04/02/24 1234)  iohexol  (OMNIPAQUE ) 350 MG/ML injection 75 mL (75 mLs Intravenous Contrast Given 04/02/24 1346)  cephALEXin (KEFLEX) capsule 500 mg (500 mg Oral Given 04/02/24 1520)    Medical Decision Making:   Sara Morton is a 83 y.o. female who presents for dizziness as per above.  Physical exam is pertinent for no focal neurologic abnormality, 2 to 3-second capillary refill.   The differential includes but is not limited to peripheral causes: Benign paroxysmal positional vertigo, Vestibular neuritis, labyrinthitis, Herpes zoster oticus, Meniere disease, Acoustic neuroma, Aminoglycoside toxicity, Otitis media. Central causes include: Migrainous vertigo, Brainstem ischemia, Cerebellar infarction and hemorrhage, Chiari malformation, Multiple sclerosis, Episodic ataxia type 2, posterior stroke, carotid artery dissection.  Independent historian:  None  External data reviewed: Labs: reviewed prior labs for baseline  Initial Plan:   Screening labs including CBC and Metabolic panel to evaluate for infectious or metabolic etiology of disease.  Urinalysis with reflex culture ordered to evaluate for UTI or relevant urologic/nephrologic pathology.  CTA head to evaluate for structural/ischemic intracranial pathology.  EKG and single troponin to evaluate for cardiac pathology. Objective evaluation as below reviewed   Labs: Ordered, Independent interpretation, and Details: CBC without leukocytosis or thrombocytopenia.  Stable anemia in comparison to prior.  BMP without AKI or emergent electrolyte derangement.  Troponin undetectable.  UA concerning for UTI with present WBCs, nitrites, LE, WBCs.  Radiology: Ordered, Independent interpretation, Details: Personally viewed CT of the head, do not appreciate ICH or LVO, and All  images reviewed independently.  Agree with radiology report at this time.   CT Angio Head W or Wo Contrast Result Date: 04/02/2024 EXAM: CTA Head without and with Intravenous Contrast CLINICAL HISTORY: Headache, dizziness, evaluate for posterior stroke. TECHNIQUE: Axial CTA images of the head without and with intravenous contrast. MIP reconstructed images were created and reviewed. Dose reduction technique was used including one or more of the following: automated exposure control, adjustment of mA and kV according to patient size, and/or iterative reconstruction. CONTRAST: Without and with; 75 mL iohexol  (OMNIPAQUE ) 350 MG/ML injection. COMPARISON: None provided. FINDINGS: INTERNAL CAROTID ARTERIES: The intracranial ICAs are patent with no significant stenosis. No occlusion. No aneurysm. ANTERIOR CEREBRAL ARTERIES: No significant stenosis. No occlusion. No aneurysm. MIDDLE CEREBRAL ARTERIES: No significant stenosis. No occlusion. No aneurysm. POSTERIOR CEREBRAL ARTERIES: No significant stenosis. No occlusion. No aneurysm. BASILAR  ARTERY: No significant stenosis. No occlusion. No aneurysm. VERTEBRAL ARTERIES: No significant stenosis. No occlusion. No aneurysm. BRAIN: Mild volume loss. No mass, hemorrhage, or extra-axial collection. SOFT TISSUES: No acute finding. No masses or lymphadenopathy. BONES: No acute osseous abnormality. IMPRESSION: 1. No evidence of significant stenosis, aneurysmal dilatation, or dissection involving the arteries of the head. 2. No mass, hemorrhage, or extra-axial collection. Electronically signed by: Franky Stanford MD 04/02/2024 02:19 PM EDT RP Workstation: [id number]    EKG/Medicine tests: Ordered and Independent interpretation EKG Interpretation: Sinus rhythm Abnormal R-wave progression, early transition Inferior infarct, old Consider anterior infarct Confirmed by Rogelia Satterfield ([id number]) on 04/02/2024 12:12:32 PM                Interventions: Compazine, meclizine, fluid bolus, Keflex  See the EMR for full details regarding lab and imaging results.  Patient is acutely dizzy at this time.  Compazine given for headache and nausea.  Meclizine given for symptomatic relief as well as fluid bolus.  Patient is FANG-D negative, therefore do not feel that patient requires activation of as a code stroke at this time, however given she has had 2 days of intermittent symptoms, do feel that she warrants CTA of the head and neck as well as screening labs.  EKG obtained and reassuring against ischemia.  Patient without evidence of AOM on exam, nor mastoiditis.  Patient does not have known history of Mnire's syndrome, does not have any facial pain, lesions consistent with herpes zoster otic us .  Does not have any recent viral symptoms concerning for vestibular neuritis, labyrinthitis.  Patient's symptoms do not provokable with lateral head movement, therefore doubt BPPV.  Patient without recent aminoglycoside usage, therefore doubt this.  Given patient reports that symptoms are similar to prior episodes of vertigo, did  consider vertigo, patient also has mildly delayed capillary refill, and was difficult to obtain IV access on, therefore concerns for mild dehydration which may be contributing to patient's symptoms.  Patient given fluid bolus, Compazine, meclizine.  CBC and CMP are reassuring, UA does reveal UTI which we will treat with Keflex.  On reevaluation, patient reports resolution of headache, however still endorses dizziness heaviness of her head, though notably is able to ambulate unassisted.  Given ongoing symptoms, do feel that there is a possible underlying posterior stroke at potentially can be managed by CT head, CTA, therefore do feel that patient warrants MRI to further evaluate for stroke.  Patient expressed hesitancy given this would require ED to ED transfer to [facility], requested that we further discussed that with her niece, Delos.  Henrietta called via speaker phone at bedside with the  patient, and the 3 of us  discussed patient's current presentation, ongoing symptoms, and how this resuspicion for possible posterior stroke.  Henrietta told patient that she should get the MRI completed, and that she [employer]) will come and sit with her in the emergency department at [facility].  After discussing with her niece, patient is amenable to having MRI performed, will transfer ED to ED to [facility] for MRI, Dr. Dreama accepting for transfer.   Discharged to home with instructions to perform home epley maneuvers and to take meclizine as prescribed. Follow-up recommended with PCP. Strict return precautions given.   Presentation is most consistent with acute complicated illness and Current presentation is complicated by underlying chronic conditions  Discussion of management or test interpretations with external provider(s): None  Risk Drugs:Prescription drug management Treatment: ED ED transfer for MRI, further treatment pending results of MRI  Disposition: ED to ED transfer to [facility] for MRI  to rule out posterior stroke  MDM generated using voice dictation software and may contain dictation errors.  Please contact me for any clarification or with any questions.   Clinical Impression:  1. Dizziness      Transfer via Transport   Final Clinical Impression(s) / ED Diagnoses Final diagnoses:  Dizziness    Rx / DC Orders ED Discharge Orders          Ordered    cephALEXin (KEFLEX) 500 MG capsule  3 times daily        04/02/24 1515             Rogelia Jerilynn RAMAN, MD 04/02/24 [phone]

## 2024-04-02 NOTE — ED Notes (Signed)
 Called [facility] to transport the patient to [facility] for an MRI--Dr. CHARLENA Massy is accepting

## 2024-04-02 NOTE — ED Notes (Addendum)
 Patient transported to CT
